# Patient Record
Sex: Male | Born: 1955 | Race: White | Hispanic: No | Marital: Single | Smoking: Never smoker
Health system: Southern US, Community
[De-identification: ages and names within clinical notes are randomized; demographics above are authoritative.]

## PROBLEM LIST (undated history)

## (undated) DIAGNOSIS — Z930 Tracheostomy status: Secondary | ICD-10-CM

## (undated) DIAGNOSIS — J9621 Acute and chronic respiratory failure with hypoxia: Secondary | ICD-10-CM

## (undated) DIAGNOSIS — J189 Pneumonia, unspecified organism: Secondary | ICD-10-CM

## (undated) DIAGNOSIS — G7100 Muscular dystrophy, unspecified: Secondary | ICD-10-CM

## (undated) HISTORY — PX: CHOLECYSTECTOMY: SHX55

---

## 2014-02-27 ENCOUNTER — Encounter (HOSPITAL_COMMUNITY): Payer: Self-pay | Admitting: Emergency Medicine

## 2014-02-27 ENCOUNTER — Observation Stay (HOSPITAL_COMMUNITY)
Admission: EM | Admit: 2014-02-27 | Discharge: 2014-02-28 | Disposition: A | Discharge status: Home / Self Care | Payer: Medicare Other | Attending: Family Medicine | Admitting: Family Medicine

## 2014-02-27 ENCOUNTER — Emergency Department (HOSPITAL_COMMUNITY): Payer: Medicare Other

## 2014-02-27 DIAGNOSIS — R0602 Shortness of breath: Secondary | ICD-10-CM | POA: Insufficient documentation

## 2014-02-27 DIAGNOSIS — G7111 Myotonic muscular dystrophy: Secondary | ICD-10-CM | POA: Diagnosis present

## 2014-02-27 DIAGNOSIS — R059 Cough, unspecified: Secondary | ICD-10-CM | POA: Diagnosis not present

## 2014-02-27 DIAGNOSIS — R42 Dizziness and giddiness: Secondary | ICD-10-CM | POA: Diagnosis not present

## 2014-02-27 DIAGNOSIS — R0789 Other chest pain: Principal | ICD-10-CM | POA: Insufficient documentation

## 2014-02-27 DIAGNOSIS — R55 Syncope and collapse: Secondary | ICD-10-CM | POA: Diagnosis not present

## 2014-02-27 DIAGNOSIS — R05 Cough: Secondary | ICD-10-CM | POA: Diagnosis not present

## 2014-02-27 DIAGNOSIS — G7109 Other specified muscular dystrophies: Secondary | ICD-10-CM | POA: Diagnosis not present

## 2014-02-27 DIAGNOSIS — E785 Hyperlipidemia, unspecified: Secondary | ICD-10-CM | POA: Diagnosis present

## 2014-02-27 DIAGNOSIS — I959 Hypotension, unspecified: Secondary | ICD-10-CM | POA: Diagnosis not present

## 2014-02-27 DIAGNOSIS — R079 Chest pain, unspecified: Secondary | ICD-10-CM | POA: Diagnosis present

## 2014-02-27 DIAGNOSIS — G47419 Narcolepsy without cataplexy: Secondary | ICD-10-CM

## 2014-02-27 HISTORY — DX: Muscular dystrophy, unspecified: G71.00

## 2014-02-27 LAB — BASIC METABOLIC PANEL
ANION GAP: 14 (ref 5–15)
BUN: 13 mg/dL (ref 6–23)
CALCIUM: 9.2 mg/dL (ref 8.4–10.5)
CO2: 26 mEq/L (ref 19–32)
Chloride: 103 mEq/L (ref 96–112)
Creatinine, Ser: 0.79 mg/dL (ref 0.50–1.35)
GFR calc non Af Amer: >90 mL/min (ref >90)
Glucose, Bld: 114 mg/dL — ABNORMAL HIGH (ref 70–99)
POTASSIUM: 4 meq/L (ref 3.7–5.3)
SODIUM: 143 meq/L (ref 137–147)

## 2014-02-27 LAB — I-STAT TROPONIN, ED: TROPONIN I, POC: 0.01 ng/mL (ref 0.00–0.08)

## 2014-02-27 LAB — CBC
HCT: 39.6 % (ref 39.0–52.0)
Hemoglobin: 13 g/dL (ref 13.0–17.0)
MCH: 30.9 pg (ref 26.0–34.0)
MCHC: 32.8 g/dL (ref 30.0–36.0)
MCV: 94.1 fL (ref 78.0–100.0)
PLATELETS: 235 10*3/uL (ref 150–400)
RBC: 4.21 MIL/uL — ABNORMAL LOW (ref 4.22–5.81)
RDW: 13.4 % (ref 11.5–15.5)
WBC: 5.3 10*3/uL (ref 4.0–10.5)

## 2014-02-27 LAB — PROTIME-INR
INR: 0.98 (ref 0.00–1.49)
Prothrombin Time: 13 seconds (ref 11.6–15.2)

## 2014-02-27 LAB — ETHANOL: Alcohol, Ethyl (B): <11 mg/dL (ref 0–11)

## 2014-02-27 LAB — PRO B NATRIURETIC PEPTIDE: Pro B Natriuretic peptide (BNP): 46 pg/mL (ref 0–125)

## 2014-02-27 MED ORDER — ASPIRIN 81 MG PO CHEW
81.0000 mg | CHEWABLE_TABLET | Freq: Every day | ORAL | Status: DC
  Administered 2014-02-28: 81 mg via ORAL
  Filled 2014-02-27: qty 1

## 2014-02-27 MED ORDER — SODIUM CHLORIDE 0.9 % IV BOLUS (SEPSIS)
1000.0000 mL | INTRAVENOUS | Status: AC
  Administered 2014-02-27: 1000 mL via INTRAVENOUS

## 2014-02-27 NOTE — ED Notes (Signed)
Family arrived and updated on pt. Told we are still waiting for labs to result.

## 2014-02-27 NOTE — ED Notes (Signed)
Portable xray at bedside.

## 2014-02-27 NOTE — ED Notes (Signed)
Pt c/o intermittent chest pressure and sob. sts he has had a cold recently. Then today while he was outside felt very lightheaded and thought he was going to pass out, sts he became very sweating and weak feeling. Nad, skin warm and dry, resp e/u.

## 2014-02-27 NOTE — H&P (Signed)
Family Medicine Teaching [facility] Ad[facility] History and Physical Service Pager: [phone]  Patient name: Carl Galvan Medical record number: [id number] Date of birth: 01/03/1956 Age: 58 y.o. Gender: male  Primary Care Provider: No primary provider on file. Consultants: None Code Status: FULL   Chief Complaint: Pre-syncope episode   Assessment and Plan: Carl Catholicndre R Noblett is a 58 y.o. male presenting with a pre-syncope episode lasting 30 minutes followed by chest pressure. PMH is significant for myotonic muscular dystrophy, hypercholesteremia, and narcolepsy.  Pre-Syncope/Chest pressure: From the HPI, this does not seem like a vasovagal even. Glucose was 112 on admission. EKG showed an incomplete RBBB in NSR. May have been secondary to dehydration as patient was hypotensive on arrival, responding well to IVF. Head CT with no acute changes. Patient also has a h/o myotonic muscular dystrophy which can lead to cardiac conduction abnormalities.  -Admit to cardiac telemetry  -Trop neg x1- cycle x 2 more -Repeat EKG in AM -Risk stratification labs: TSH, lipids, A1c pending -Consider echo -Consider cardiology consult  -May consider a Holter monitor as an outpatient to look for conduction abnormalities - Consider TTE in am      Hypotension: BP 90s/70s on admission, improved with IVF. From labs and exam, patient does not look dehydrated. VSS currently  - Continue to monitor  - NS @ 100cc/hr - 1 L bolus for hypotension if needed  Myotonic Muscular Dystrophy:Patient with baseline weakness in the ankles and wrists/hands. Notes that he is supposed to use BiPAP nightly due to diaphragm weakness but does not - Will hold BiPAP for now - [place] if needed at night.  - If O2 stats drop or increased work of breathing at night, low threshold for starting BiPAP  Bronchitis: Recently started on Z-pack by his PCP for what the patient describes as bronchitis. Chest Xray does not demonstrate any infiltrate.  Crackles on exam bilaterally. Due to his myotonic muscular dystrophy, pt is at an increased risk of PNA and other infections. - Continue Azithromycin (250 mg X 4 more doses)  Hypercholesterolemia: Unsure of patient's baseline. -Continue home regiment of pravastatin 80mg  daily -Lipid panel pending   Narcolepsy - Continue home regimen of Ritalin in AM  FEN/GI: heart healthy diet/ NS @ 100cc/hr  Prophylaxis: SubQ heparin   Disposition: Telemetry for observation. Suspect patient may go home tomorrow.   History of Present Illness: Carl Catholicndre R Renz is a 58 y.o. male presenting with a pre-syncope episode lasting 30 minutes followed by chest pressure in the ED.  The patient notes that he was in his usual state of health this AM drinking a beer in the shade when he began to feel dizzy, light-headed, diaphoretic, and clammy.  He denies being over-heated or dehydrated. Denies chest pain or SOB at that time. He went inside but had no improvement so EMS was called.  His wife noted that he appeared pale and did not "seem like himself," however denies any confusion, change in speech, or facial droop.  En route, he was found to have a RBBB and was given fluids and ASA. Code STEMI was called and canceled after cardiology saw the patient's EKG in the ED.   In the ED he was found to be hypotensive in the 90s/70s and responded well to fluid bolus.  He began to have chest pressure that last a few minutes that came and went without exertion. He notes that it did not radiate and denies any associated SOB or diaphoresis.    Review Of  Systems: 12 point ROS all negative in HPI with the exception of: Patient recently started getting a "chest cold" with SOB and cough. He was started on a Z-pack however has only had 1 day of treatment.  Otherwise 12 point review of systems was performed and was unremarkable.  Patient Active Problem List   Diagnosis Date Noted  . Chest pain 02/27/2014  . Near syncope 02/27/2014  . HLD  (hyperlipidemia) 02/27/2014   Past Medical History: Past Medical History  Diagnosis Date  . Muscular dystrophy    Past Surgical History: Past Surgical History  Procedure Laterality Date  . Cholecystectomy     Social History: History  Substance Use Topics  . Smoking status: Never Smoker   . Smokeless tobacco: Not on file  . Alcohol Use: Yes     Comment: occassionally    Please also refer to relevant sections of EMR.  Family History: No family history on file. Allergies and Medications: No Known Allergies No current facility-administered medications on file prior to encounter.   No current outpatient prescriptions on file prior to encounter.    Objective: BP 123/75  Pulse 75  Temp(Src) 97.4 F (36.3 C)  Resp 17  Ht 5\' 2"  (1.575 m)  Wt 170 lb (77.111 kg)  BMI 31.09 kg/m2  SpO2 95% Exam: General: 58 y/o male sitting up in bed in NAD. HEENT: Atraumatic. PERRLA. EOMI. Oropharynx clear. MMM. Cardiovascular: Regular rate and rhythm. Distant heart sounds. No murmurs, rubs, or gallops appreciated. No LE edema. Good radial and DP pulses bilaterally. Respiratory: Shallow breathing, poor air movement. Crackles heard throughout the lungs bilaterally, L>R. No wheezing or rhonchi noted.  Abdomen: +BS. Soft, non-distended. Non-tender. No rebound or guarding.  Skin: No rashes noted. Neuro: Speech clear although patient often mumbles. No facial droop. Facial movements symmetric. Atrophic changes of the ankles.  4+/5 plantarflexion bilaterally. Unable to dorsiflex bilaterally. 5/5 at the hips bilaterally. 3/5 in hand grip bilaterally. BL foot drop more pronounced on the R  Labs and Imaging:  CT of Head:No acute abnormality noted. CXR: The study is quite limited due to hypoinflation. There is no evidence of pneumonia nor CHF.   CBC BMET   Recent Labs Lab 02/27/14 1830  WBC 5.3  HGB 13.0  HCT 39.6  PLT 235    Recent Labs Lab 02/27/14 1830  NA 143  K 4.0  CL 103  CO2  26  BUN 13  CREATININE 0.79  GLUCOSE 114*  CALCIUM 9.2    Joanna Puffrystal S Dorsey, MD 02/27/2014, 10:03 PM PGY-1, [facility] FPTS Intern pager: [phone], text pages welcome  I have seen Mr. Etta GrandchildGoneau with Dr. Leonides Schanzorsey and I agree with her documentation above. My modifications are in blue.   Murtis SinkSam Bradshaw, MD [facility] Resident, PGY-3 02/27/2014, 10:52 PM

## 2014-02-27 NOTE — ED Notes (Signed)
EKG from EMS was transmitted and seen by D. Mackelhaney, who canceled the code STEMI.

## 2014-02-27 NOTE — ED Notes (Signed)
Internal medicine consult at bedside.

## 2014-02-27 NOTE — ED Provider Notes (Signed)
CSN: [id number]     Arrival date & time 02/27/14  1811 History   First MD Initiated Contact with Patient 02/27/14 1818     Chief Complaint  Patient presents with  . Code STEMI  . Shortness of Breath  . Chest Pain     (Consider location/radiation/quality/duration/timing/severity/associated sxs/prior Treatment) Patient is a 58 y.o. male presenting with near-syncope. The history is provided by the patient.  Near Syncope This is a new problem. Episode onset: 1-3 hours ago. Episode frequency: once. The problem has been resolved. Associated symptoms include chest pain and shortness of breath. Pertinent negatives include no abdominal pain and no headaches. The symptoms are aggravated by walking. The symptoms are relieved by rest. He has tried nothing for the symptoms. The treatment provided significant relief.    Past Medical History  Diagnosis Date  . Muscular dystrophy    Past Surgical History  Procedure Laterality Date  . Cholecystectomy     No family history on file. History  Substance Use Topics  . Smoking status: Never Smoker   . Smokeless tobacco: Not on file  . Alcohol Use: Yes     Comment: occassionally    Review of Systems  Constitutional: Negative for fever.  HENT: Negative for drooling and rhinorrhea.   Eyes: Negative for pain.  Respiratory: Positive for cough and shortness of breath.   Cardiovascular: Positive for chest pain and near-syncope. Negative for leg swelling.  Gastrointestinal: Negative for nausea, vomiting, abdominal pain and diarrhea.  Genitourinary: Negative for dysuria and hematuria.  Musculoskeletal: Negative for gait problem and neck pain.  Skin: Negative for color change.  Neurological: Positive for dizziness and light-headedness. Negative for numbness and headaches.  Hematological: Negative for adenopathy.  Psychiatric/Behavioral: Negative for behavioral problems.  All other systems reviewed and are negative.     Allergies  Review of  patient's allergies indicates no known allergies.  Home Medications   Prior to Admission medications   Not on File   BP 106/56  Pulse 82  Temp(Src) 97.4 F (36.3 C)  Resp 21  Ht 5\' 2"  (1.575 m)  Wt 170 lb (77.111 kg)  BMI 31.09 kg/m2  SpO2 95% Physical Exam  Nursing note and vitals reviewed. Constitutional: He is oriented to person, place, and time. He appears well-developed and well-nourished.  HENT:  Head: Normocephalic and atraumatic.  Right Ear: External ear normal.  Left Ear: External ear normal.  Nose: Nose normal.  Mouth/Throat: Oropharynx is clear and moist. No oropharyngeal exudate.  Eyes: Conjunctivae and EOM are normal. Pupils are equal, round, and reactive to light.  Neck: Normal range of motion. Neck supple.  Cardiovascular: Normal rate, regular rhythm, normal heart sounds and intact distal pulses.  Exam reveals no gallop and no friction rub.   No murmur heard. Pulmonary/Chest: Effort normal. No respiratory distress. He has no wheezes.  Rhonchorous in the bases bilaterally. Worse on the right lung base.  Abdominal: Soft. Bowel sounds are normal. He exhibits no distension. There is no tenderness. There is no rebound and no guarding.  Musculoskeletal: Normal range of motion. He exhibits no edema and no tenderness.  Neurological: He is alert and oriented to person, place, and time.  alert, oriented x3 speech: normal in context and clarity memory: intact grossly cranial nerves II-XII: intact motor strength: full proximally and distally w/ exception of plantar and dorsiflexion of his ankles which is weak but he reports unchanged d/t his muscular dystrophy no involuntary movements or tremors sensation: intact to light touch diffusely  cerebellar: finger-to-nose intact gait: normal forwards and backwards, pt feels wobbly   Skin: Skin is warm and dry.  Psychiatric: He has a normal mood and affect. His behavior is normal.    ED Course  Procedures (including  critical care time) Labs Review Labs Reviewed  CBC - Abnormal; Notable for the following:    RBC 4.21 (*)    All other components within normal limits  BASIC METABOLIC PANEL - Abnormal; Notable for the following:    Glucose, Bld 114 (*)    All other components within normal limits  LIPID PANEL - Abnormal; Notable for the following:    Cholesterol 239 (*)    Triglycerides 293 (*)    VLDL 59 (*)    LDL Cholesterol 138 (*)    All other components within normal limits  BASIC METABOLIC PANEL - Abnormal; Notable for the following:    Glucose, Bld 108 (*)    All other components within normal limits  PRO B NATRIURETIC PEPTIDE  PROTIME-INR  ETHANOL  TROPONIN I  TROPONIN I  TSH  HEMOGLOBIN A1C  I-STAT TROPOININ, ED    Imaging Review Dg Chest Port 1 View  02/27/2014   CLINICAL DATA:  Chest pressure and shortness of breath; upper respiratory infection symptoms for 2 weeks with nonproductive cough  EXAM: PORTABLE CHEST - 1 VIEW  COMPARISON:  None.  FINDINGS: The lungs are hypoinflated. The cardiac silhouette is obscured. There is no pleural effusion. The pulmonary vascularity is normal. The trachea is midline. The observed bony structures are normal.  IMPRESSION: The study is quite limited due to hypoinflation. There is no evidence of pneumonia nor CHF.   Electronically Signed   By: David  [place]azil[place]   On: 02/27/2014 18:41     EKG Interpretation   Date/Time:  Saturday February 27 2014 18:17:44 EDT Ventricular Rate:  91 PR Interval:  180 QRS Duration: 117 QT Interval:  385 QTC Calculation: 474 R Axis:     Text Interpretation:  Sinus rhythm Incomplete right bundle branch block  Confirmed by Gabrien Mentink  MD, Dorette Hartel ([id number]) on 02/27/2014 7:17:29 PM      MDM   Final diagnoses:  Chest pain at rest  Pre-syncope  Hypotension, unspecified hypotension type    7:12 PM 58 y.o. male with a history of muscular dystrophy, high cholesterol who presents with an episode of presyncope which occurred  several hours ago. He states that he became lightheaded/dizzy while sitting after drinking one beer. He became diaphoretic and stood up and walked into the house and laid down. His symptoms lasted approximately 30 minutes. His EKG was suspicious for MI in route but he could STEMI was called off by cardiology. He denies any chest pain or shortness of breath prior to arrival. He did state that he felt some chest pressure here for a brief period of time but denies this on exam currently. He is currently asymptomatic. He does feel wobbly when he walks but is able to ambulate. Will get screening labs and imaging.  Pt continues to appear well. Will admit d/t hypotension which seems to be resolving w/ IVF, episode of cp, pre-syncope.     Junius ArgyleForrest S Rigel Filsinger, MD 02/28/14 1019

## 2014-02-27 NOTE — ED Notes (Signed)
Per EMS - pt had a near syncopal episode then called ems. Upon arrival pt was pale and diaphoretic. BP 102 palpated. EKG showing STEMI. Initial BP was 90 systolic. HR 90s. Initial O2 sat 87%, put on 4 liters/min raised to 97%. 20 G in left hand. 18G in left forearm. Alert and oriented x 4. Hx of mitonic muscular dystrophy. NKA. Denies stents/MI. Administered 4 mg of ASA. Held off on nitro d/t BP. Pt c/o chest pressure rating at 2/10. Pt drank 1 beer prior to this. Was outside when all this started.

## 2014-02-28 DIAGNOSIS — R0602 Shortness of breath: Secondary | ICD-10-CM | POA: Diagnosis not present

## 2014-02-28 DIAGNOSIS — R55 Syncope and collapse: Secondary | ICD-10-CM | POA: Diagnosis not present

## 2014-02-28 DIAGNOSIS — R0789 Other chest pain: Secondary | ICD-10-CM | POA: Diagnosis not present

## 2014-02-28 DIAGNOSIS — I959 Hypotension, unspecified: Secondary | ICD-10-CM | POA: Diagnosis not present

## 2014-02-28 LAB — BASIC METABOLIC PANEL
Anion gap: 12 (ref 5–15)
BUN: 13 mg/dL (ref 6–23)
CHLORIDE: 108 meq/L (ref 96–112)
CO2: 27 meq/L (ref 19–32)
Calcium: 9 mg/dL (ref 8.4–10.5)
Creatinine, Ser: 0.75 mg/dL (ref 0.50–1.35)
GFR calc Af Amer: >90 mL/min (ref >90)
GFR calc non Af Amer: >90 mL/min (ref >90)
Glucose, Bld: 108 mg/dL — ABNORMAL HIGH (ref 70–99)
Potassium: 4.2 mEq/L (ref 3.7–5.3)
Sodium: 147 mEq/L (ref 137–147)

## 2014-02-28 LAB — TROPONIN I
Troponin I: <0.3 ng/mL (ref <0.30)
Troponin I: <0.3 ng/mL (ref <0.30)

## 2014-02-28 LAB — HEMOGLOBIN A1C
HEMOGLOBIN A1C: 5.4 % (ref <5.7)
Mean Plasma Glucose: 108 mg/dL (ref <117)

## 2014-02-28 LAB — LIPID PANEL
CHOL/HDL RATIO: 5.7 ratio
Cholesterol: 239 mg/dL — ABNORMAL HIGH (ref 0–200)
HDL: 42 mg/dL (ref >39)
LDL Cholesterol: 138 mg/dL — ABNORMAL HIGH (ref 0–99)
Triglycerides: 293 mg/dL — ABNORMAL HIGH (ref <150)
VLDL: 59 mg/dL — ABNORMAL HIGH (ref 0–40)

## 2014-02-28 LAB — TSH: TSH: 2.25 u[IU]/mL (ref 0.350–4.500)

## 2014-02-28 MED ORDER — SIMVASTATIN 40 MG PO TABS
40.0000 mg | ORAL_TABLET | Freq: Every day | ORAL | Status: DC
  Filled 2014-02-28: qty 1

## 2014-02-28 MED ORDER — IPRATROPIUM-ALBUTEROL 0.5-2.5 (3) MG/3ML IN SOLN
3.0000 mL | RESPIRATORY_TRACT | Status: DC | PRN
  Administered 2014-02-28: 3 mL via RESPIRATORY_TRACT
  Filled 2014-02-28: qty 3

## 2014-02-28 MED ORDER — ASPIRIN 81 MG PO CHEW: 81.0000 mg | CHEWABLE_TABLET | Freq: Every day | ORAL | Status: DC

## 2014-02-28 MED ORDER — AZITHROMYCIN 250 MG PO TABS
250.0000 mg | ORAL_TABLET | Freq: Every day | ORAL | Status: DC
  Administered 2014-02-28: 250 mg via ORAL
  Filled 2014-02-28: qty 1

## 2014-02-28 MED ORDER — HEPARIN SODIUM (PORCINE) 5000 UNIT/ML IJ SOLN
5000.0000 [IU] | Freq: Three times a day (TID) | INTRAMUSCULAR | Status: DC
  Administered 2014-02-28 (×2): 5000 [IU] via SUBCUTANEOUS
  Filled 2014-02-28 (×5): qty 1

## 2014-02-28 MED ORDER — ATORVASTATIN CALCIUM 40 MG PO TABS: 40.0000 mg | ORAL_TABLET | Freq: Every day | ORAL | Status: DC

## 2014-02-28 MED ORDER — IPRATROPIUM-ALBUTEROL 0.5-2.5 (3) MG/3ML IN SOLN
3.0000 mL | RESPIRATORY_TRACT | Status: DC
Start: 2014-02-28 — End: 2014-02-28

## 2014-02-28 MED ORDER — ATORVASTATIN CALCIUM 40 MG PO TABS
40.0000 mg | ORAL_TABLET | Freq: Every day | ORAL | Status: DC
  Filled 2014-02-28: qty 1

## 2014-02-28 MED ORDER — IPRATROPIUM-ALBUTEROL 0.5-2.5 (3) MG/3ML IN SOLN
3.0000 mL | Freq: Four times a day (QID) | RESPIRATORY_TRACT | Status: DC | PRN
  Administered 2014-02-28: 3 mL via RESPIRATORY_TRACT
  Filled 2014-02-28: qty 3

## 2014-02-28 MED ORDER — METHYLPHENIDATE HCL ER (LA) 10 MG PO CP24: 20.0000 mg | ORAL_CAPSULE | Freq: Two times a day (BID) | ORAL | Status: DC

## 2014-02-28 MED ORDER — ONDANSETRON HCL 4 MG/2ML IJ SOLN: 4.0000 mg | Freq: Four times a day (QID) | INTRAMUSCULAR | Status: DC | PRN

## 2014-02-28 MED ORDER — LORATADINE 10 MG PO TABS
10.0000 mg | ORAL_TABLET | Freq: Every day | ORAL | Status: DC
  Administered 2014-02-28: 10 mg via ORAL
  Filled 2014-02-28: qty 1

## 2014-02-28 MED ORDER — ACETAMINOPHEN 325 MG PO TABS: 650.0000 mg | ORAL_TABLET | ORAL | Status: DC | PRN

## 2014-02-28 MED ORDER — SODIUM CHLORIDE 0.9 % IV SOLN
INTRAVENOUS | Status: AC
  Administered 2014-02-28: via INTRAVENOUS

## 2014-02-28 NOTE — Progress Notes (Signed)
CALL PAGER [phone] for any questions or notifications regarding this patient   FMTS Attending Daily Note: Carl LevySara Talisha Erby MD  Attending pager:[phone]  office [phone]  I  have seen and examined this patient, reviewed their chart. I have discussed this patient with the resident. I agree with the resident's findings, assessment and care plan. I discussed his workup so far with him and his fiance at bedside. He is fairly complex medically . The workup to date has been negative. We had planned to get ECHO---just to rule out some new cardiomyopathy or other event that would contribute to his presyncope. We will not be able to get that until at least tomorrow AM. He would rather be discharged and do outpatient ECHo which I think is fine. It is not emergent. One of the main reasions I really want to get it for him is his underlying muscular dystrophy--he says he has not had f/u ECHO in several years.   I suspect that his presyncopal event was a combination of factors: 1) hot weather yesterday, outside for 4th of July celebration,  2) alcohol intake---he says this has never bothered him before but likely a contributor; 3) underlying muscular dystrophy and narcolepsy. His troponins were negative, EKG incomplete RBBB but otherwise negative. Certainly no sign of MI or myocardial injury. Telemetry non eventful. Electrolytes Ok. I think he is stable for d/c with out patiient ECHO in next week or so. We will give him written instructions reminding him to contact his local PCP to set this up as [place] and it would be longer trip for him to come here by his report.

## 2014-02-28 NOTE — Progress Notes (Signed)
Family Medicine Teaching Service Daily Progress Note Intern Pager: [phone]  Patient name: Carl Galvan Medical record number: [id number] Date of birth: 03/08/1956 Age: 58 y.o. Gender: male  Primary Care Provider: No primary provider on file. Consultants: None Code Status: FULL  Pt Overview and Major Events to Date:  58 y/o presenting with a pre-syncope episode lasting 30 minutes followed by chest pressure while in the ED.  Assessment and Plan: Carl Galvan is a 58 y.o. male presenting with a pre-syncope episode lasting 30 minutes followed by chest pressure. PMH is significant for myotonic muscular dystrophy, hypercholesteremia, and narcolepsy.  Pre-Syncope/Chest pressure: From the narrative, this does not seem like a vasovagal even. Glucose was 112 on admission. EKG showed an incomplete RBBB in NSR. May have been secondary to dehydration as patient was hypotensive on arrival, responding well to IVF. Head CT with no acute changes. Patient also has a h/o myotonic muscular dystrophy which can lead to cardiac conduction abnormalities.  -Admit to cardiac telemetry  -Trop neg 3 -Repeat EKG in AM showed bradycardia with a 1st degree AV block and what appears to be an interventricular conduction abnormality in III, no incomplete RBBB noted. -Risk stratification labs: A1c pending  -Consider echocardiogram  -May consider a Holter monitor as an outpatient to look for conduction abnormalities   Hypotension: BP 90s/70s on admission, improved with IVF. From labs and exam, patient does not look dehydrated. VSS. Orthostatic vitals normal.  - Continue to monitor   - NS @ 100cc/hr  - 1 L bolus for hypotension if needed   Myotonic Muscular Dystrophy:Patient with baseline weakness in the ankles and wrists/hands. Notes that he is supposed to use BiPAP nightly due to diaphragm weakness but does not  - Will hold BiPAP for now  - [place] if needed at night.  - [place]one[place] PRN - If O2 stats drop or increased work  of breathing at night, low threshold for starting BiPAP   Bronchitis: Recently started on Z-pack by his PCP for what the patient describes as bronchitis. Chest Xray does not demonstrate any infiltrate. Crackles on exam bilaterally. Due to PMH of myotonic muscular dystrophy, pt is at an increased risk of PNA and other infections.  - Continue Azithromycin 250mg  x 4 doses (1st dose 7/5)  Hypercholesterolemia: Unsure of patient's baseline. Non-fasting lipid panel revealed a grossly elevated cholesterol at 239, triglycerides at 293, HDL to 42, and LDL at 138 -Given high CV risk will increase to high intensity statin: start atorvastatin 40mg  daily.  Narcolepsy  - Continue home regimen of Ritalin in AM   FEN/GI: heart healthy diet/ NS @ 100cc/hr  Prophylaxis: SubQ heparin   Disposition: Telemetry for observation. Suspect patient may go home today or tomorrow.  Subjective:  Over night the patient is doing well. He wonders why his BP has been low but denies any symptoms of the hypotension. He denies any SOB, chest pain, light headedness, or dizziness. He endorses chest congestion which he's had "forever" that no one can figure out.  Objective: Temp:  [97.3 F (36.3 C)-97.4 F (36.3 C)] 97.3 F (36.3 C) (07/05 0000) Pulse Rate:  [69-95] 85 (07/05 0107) Resp:  [14-27] 20 (07/05 0107) BP: (92-123)/(56-77) 108/69 mmHg (07/05 0000) SpO2:  [89 %-98 %] 94 % (07/05 0107) Weight:  [170 lb (77.111 kg)-174 lb 3.2 oz (79.017 kg)] 174 lb 3.2 oz (79.017 kg) (07/05 0000) Physical Exam: General: 58 y/o male sitting up in bed in NAD.  HEENT: Atraumatic. PERRLA. EOMI. Oropharynx clear.  MMM.  Cardiovascular: Regular rate and rhythm. Distant heart sounds. No murmurs, rubs, or gallops appreciated. No LE edema. Good radial and DP pulses bilaterally.  Respiratory: Shallow breathing, poor air movement. Crackles heard throughout the lungs bilaterally, L>R. No wheezing or rhonchi noted.  Abdomen: +BS. Soft,  non-distended. Non-tender. No rebound or guarding.  Skin: No rashes noted.  Neuro: Speech clear. No facial droop. Facial movements symmetric. No focal deficits  Laboratory:  Recent Labs Lab 02/27/14 1830  WBC 5.3  HGB 13.0  HCT 39.6  PLT 235    Recent Labs Lab 02/27/14 1830 02/28/14 0655  NA 143 147  K 4.0 4.2  CL 103 108  CO2 26 27  BUN 13 13  CREATININE 0.79 0.75  CALCIUM 9.2 9.0  GLUCOSE 114* 108*    TSH    Component Value Date/Time   TSH 2.250 02/28/2014 0125       Component Value Date/Time   CHOL 239* 02/28/2014 0125   TRIG 293* 02/28/2014 0125   HDL 42 02/28/2014 0125   CHOLHDL 5.7 02/28/2014 0125   VLDL 59* 02/28/2014 0125   LDLCALC 138* 02/28/2014 0125     Imaging/Diagnostic Tests: Ct Head Wo Contrast  02/27/2014   CLINICAL DATA:  Lightheadedness  EXAM: CT HEAD WITHOUT CONTRAST  TECHNIQUE: Contiguous axial images were obtained from the base of the skull through the vertex without intravenous contrast.  COMPARISON:  None.  FINDINGS: The bony calvarium is intact. The ventricles are of normal size and configuration. No findings to suggest acute hemorrhage, acute infarction or space-occupying mass lesion are noted.  IMPRESSION: No acute abnormality noted.   Electronically Signed   By: Alcide CleverMark  Lukens M.D.   On: 02/27/2014 19:33   Dg Chest Port 1 View  02/27/2014   CLINICAL DATA:  Chest pressure and shortness of breath; upper respiratory infection symptoms for 2 weeks with nonproductive cough  EXAM: PORTABLE CHEST - 1 VIEW  COMPARISON:  None.  FINDINGS: The lungs are hypoinflated. The cardiac silhouette is obscured. There is no pleural effusion. The pulmonary vascularity is normal. The trachea is midline. The observed bony structures are normal.  IMPRESSION: The study is quite limited due to hypoinflation. There is no evidence of pneumonia nor CHF.   Electronically Signed   By: David  [place]   On: 02/27/2014 18:41    Joanna Puffrystal S Dorsey, MD 02/28/2014, 4:34 AM PGY-1, [facility]  Family Medicine FPTS Intern pager: [phone], text pages welcome

## 2014-02-28 NOTE — Discharge Summary (Signed)
Family Medicine [facility] Discharge Summary  Patient name: Carl Galvan Medical record number: [id number] Date of birth: 12/14/1955 Age: 58 y.o. Gender: male Date of Admission: 02/27/2014  Date of Discharge: 02/28/2014 Admitting Physician: Nestor RampSara L Neal, MD  Primary Care Provider: No primary provider on file. Consultants: None  Indication for Hospitalization: Near syncope, chest pain   Discharge Diagnoses/Problem List:  Near syncope   Hyperlipidemia, uncontrolled  Myotonic muscular dystrophy  Bronchitis  Narcolepsy   Disposition: Discharge home, will follow up in the outpatient setting  Discharge Condition: Stable   Discharge Exam:  Temp: [97.3 F (36.3 C)-97.4 F (36.3 C)] 97.3 F (36.3 C) (07/05 0000)  Pulse Rate: [69-95] 85 (07/05 0107)  Resp: [14-27] 20 (07/05 0107)  BP: (92-123)/(56-77) 108/69 mmHg (07/05 0000)  SpO2: [89 %-98 %] 94 % (07/05 0107)  Weight: [170 lb (77.111 kg)-174 lb 3.2 oz (79.017 kg)] 174 lb 3.2 oz (79.017 kg) (07/05 0000)  Physical Exam:  General: 58 y/o male sitting up in bed in NAD.  HEENT: Atraumatic. PERRLA. EOMI. Oropharynx clear. MMM.  Cardiovascular: Regular rate and rhythm. Distant heart sounds. No murmurs, rubs, or gallops appreciated. No LE edema. Good radial and DP pulses bilaterally.  Respiratory: Shallow breathing, poor air movement. Crackles heard throughout the lungs bilaterally, L>R. No wheezing or rhonchi noted.  Abdomen: +BS. Soft, non-distended. Non-tender. No rebound or guarding.  Skin: No rashes noted.  Neuro: Speech clear. No facial droop. Facial movements symmetric. No focal deficits  Brief Hospital Course:  Carl Galvan is a [age] y/o male with a PMH of muscular dystrophy who presented with near syncope. While outside in the shade drinking a beer he became dizzy, light-headed, diaphoretic, clammy, and pale. At that time he denied chest pain/pressure or SOB. Symptoms lasted 20-30 minutes. EMS arrived and an EKG revealed  an incomplete RBBB. Code STEMI was called and canceled after cardiology saw patient. While in the ED, he endorsed new chest pressure that lasted several minutes and then resolved. Troponins negative x 3. Repeat EKG in AM was not concerning. Risk stratification labs obtained. Lipid panel grossly elevated. Changed statin to Lipitor. Patient had 1 day of treatment of azithromycin as an outpatient for symptoms that sounded like bronchitis. CXR with no acute changes in ED. 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   Issues for Follow Up:  -Patient would benefit from an echocardiogram as an outpatient. Discussed possibility of performing it as an inpatient, however that would require another night stay. Telemetry not concerning and patient was asymptomatic therefore patient and MD agreed it could be done as an outpatient  -Given muscular dystrophy, pt is at a higher risk of cardiac conduction abnormalities. Consider outpatient Holter monitoring if pt has additional episodes.   Significant Procedures: None  Significant Labs and Imaging:   Recent Labs Lab 02/27/14 1830  WBC 5.3  HGB 13.0  HCT 39.6  PLT 235    Recent Labs Lab 02/27/14 1830 02/28/14 0655  NA 143 147  K 4.0 4.2  CL 103 108  CO2 26 27  GLUCOSE 114* 108*  BUN 13 13  CREATININE 0.79 0.75  CALCIUM 9.2 9.0    Risk Stratification Labs  TSH    Component Value Date/Time   TSH 2.250 02/28/2014 0125   Hemoglobin A1C    Component Value Date/Time   HGBA1C 5.4 02/28/2014 0125  Lipid Panel     Component Value Date/Time   CHOL 239* 02/28/2014 0125   TRIG 293* 02/28/2014 0125   HDL 42 02/28/2014 0125   CHOLHDL 5.7 02/28/2014 0125   VLDL 59* 02/28/2014 0125   LDLCALC 138* 02/28/2014 0125     Results/Tests Pending at Time of Discharge:  None   Discharge Medications:    Medication List    STOP taking  these medications       pravastatin 80 MG tablet  Commonly known as:  PRAVACHOL      TAKE these medications       albuterol 108 (90 BASE) MCG/ACT inhaler  Commonly known as:  PROVENTIL HFA;VENTOLIN HFA  Inhale 1-2 puffs into the lungs every 6 (six) hours as needed for wheezing or shortness of breath.     aspirin 81 MG chewable tablet  Chew 1 tablet (81 mg total) by mouth daily.     atorvastatin 40 MG tablet  Commonly known as:  LIPITOR  Take 1 tablet (40 mg total) by mouth daily at 6 PM.     azithromycin 250 MG tablet  Commonly known as:  ZITHROMAX  Take 250 mg by mouth daily.     fexofenadine 180 MG tablet  Commonly known as:  ALLEGRA  Take 180 mg by mouth daily.     ipratropium-albuterol 0.5-2.5 (3) MG/3ML Soln  Commonly known as:  DUONEB  Take 3 mLs by nebulization every 6 (six) hours.     methylphenidate 20 MG 24 hr capsule  Commonly known as:  RITALIN LA  Take 20 mg by mouth 2 (two) times daily.        Discharge Instructions: Please refer to Patient Instructions section of EMR for full details.  Patient was counseled important signs and symptoms that should prompt return to medical care, changes in medications, dietary instructions, activity restrictions, and follow up appointments.   Follow-Up Appointments:     Follow-up Information   Follow up with Your primary care physician.      Call in 1 week to follow up. (For hospital follow up)       Call in 1 day to follow up. (To make appointment ASAP. With PCP to obtain Echo.)       Joanna Puffrystal S Morgon Pamer, MD 02/28/2014, 8:58 PM PGY-1, [facility]

## 2014-02-28 NOTE — Discharge Instructions (Signed)
A written prescription has been signed and should be given to you prior to leaving. This prescription is for an Echocardiogram to be performed. To fill this script, please see your Primary Care Provider and ask him/her to make an appointment for this Echocardiogram. Please do this within the week.   Supplied is information on Chest Pain with information of when to seek out medical care in the future.   Chest Pain (Nonspecific) It is often hard to give a diagnosis for the cause of chest pain. There is always a chance that your pain could be related to something serious, such as a heart attack or a blood clot in the lungs. You need to follow up with your doctor. HOME CARE  If antibiotic medicine was given, take it as directed by your doctor. Finish the medicine even if you start to feel better.  For the next few days, avoid activities that bring on chest pain. Continue physical activities as told by your doctor.  Do not use any tobacco products. This includes cigarettes, chewing tobacco, and e-cigarettes.  Avoid drinking alcohol.  Only take medicine as told by your doctor.  Follow your doctor's suggestions for more testing if your chest pain does not go away.  Keep all doctor visits you made. GET HELP IF:  Your chest pain does not go away, even after treatment.  You have a rash with blisters on your chest.  You have a fever. GET HELP RIGHT AWAY IF:   You have more pain or pain that spreads to your arm, neck, jaw, back, or belly (abdomen).  You have shortness of breath.  You cough more than usual or cough up blood.  You have very bad back or belly pain.  You feel sick to your stomach (nauseous) or throw up (vomit).  You have very bad weakness.  You pass out (faint).  You have chills. This is an emergency. Do not wait to see if the problems will go away. Call your local emergency services (911 in [place] Do not drive yourself to the hospital. MAKE SURE YOU:   Understand  these instructions.  Will watch your condition.  Will get help right away if you are not doing well or get worse. Document Released: 01/30/2008 Document Revised: 08/18/2013 Document Reviewed: 01/30/2008 [facility] Patient Information 2015 [employer] [place]. This information is not intended to replace advice given to you by your health care provider. Make sure you discuss any questions you have with your health care provider.

## 2014-02-28 NOTE — H&P (Signed)
Call Pager [phone] for any questions or notifications regarding this patient  FMTS Attending Admission Note: Less Woolsey MD Attending pager:[phone] [phone] I  have seen and examined this patient, reviewed their chart. I have discussed this patient with the resident. I agree with the resident's findings, assessment and care plan. 

## 2014-03-01 ENCOUNTER — Telehealth: Payer: Self-pay | Admitting: Family Medicine

## 2014-03-01 NOTE — Telephone Encounter (Signed)
Fiance calls, stating that patient was given a handwritten script for outpatient echocardiogram. The place where [place] having this [place] diagnosis code in order to perform echocardiogram. Please call fiance, Burna MortimerWanda at [phone] with this info. Please advise. Burna MortimerWanda states she could not read the name of MD that wrote script. Spoke to Dr. Ermalinda MemosBradshaw and he stated it was most likely Dr. Wende MottMcKeag.

## 2014-03-01 NOTE — Telephone Encounter (Signed)
Hi Renee, Can you help me with this?  PS: You even said this was going to happen!  Thank you!

## 2014-03-02 NOTE — Telephone Encounter (Signed)
The prescription was suppose to go to this persons PCP for communication stating he needed a Echo. The PCP will likely need to make the actual order. The code is  780.2. Please call his fiance Carl Galvan at [phone] and inform her of above. Thanks

## 2014-03-02 NOTE — Telephone Encounter (Signed)
Relayed message,Wanda fiance states she was only in need of DX code.Code given,she voiced great appreciation.Donyale Falcon, Virgel BouquetGiovanna S

## 2014-03-03 NOTE — Discharge Summary (Signed)
.  Family Medicine Teaching Service  Discharge Note : Attending Durward Matranga MD Pager [phone] Office [phone] I have seen and examined this patient, reviewed their chart and discussed discharge planning with the resident at the time of discharge. I agree with the discharge plan as above.  

## 2015-02-12 ENCOUNTER — Inpatient Hospital Stay (HOSPITAL_COMMUNITY): Payer: Medicare Other

## 2015-02-12 ENCOUNTER — Emergency Department (HOSPITAL_COMMUNITY): Payer: Medicare Other

## 2015-02-12 ENCOUNTER — Encounter (HOSPITAL_COMMUNITY): Payer: Self-pay | Admitting: Emergency Medicine

## 2015-02-12 ENCOUNTER — Inpatient Hospital Stay (HOSPITAL_COMMUNITY)
Admission: EM | Admit: 2015-02-12 | Discharge: 2015-02-20 | DRG: 004 | Disposition: A | Discharge status: Other Acute Inpatient Hospital | Payer: Medicare Other | Attending: Internal Medicine | Admitting: Internal Medicine

## 2015-02-12 DIAGNOSIS — J80 Acute respiratory distress syndrome: Secondary | ICD-10-CM | POA: Diagnosis present

## 2015-02-12 DIAGNOSIS — N179 Acute kidney failure, unspecified: Secondary | ICD-10-CM | POA: Diagnosis not present

## 2015-02-12 DIAGNOSIS — Z7982 Long term (current) use of aspirin: Secondary | ICD-10-CM

## 2015-02-12 DIAGNOSIS — Z9911 Dependence on respirator [ventilator] status: Secondary | ICD-10-CM

## 2015-02-12 DIAGNOSIS — R4189 Other symptoms and signs involving cognitive functions and awareness: Secondary | ICD-10-CM

## 2015-02-12 DIAGNOSIS — E876 Hypokalemia: Secondary | ICD-10-CM | POA: Diagnosis present

## 2015-02-12 DIAGNOSIS — J9600 Acute respiratory failure, unspecified whether with hypoxia or hypercapnia: Secondary | ICD-10-CM | POA: Diagnosis present

## 2015-02-12 DIAGNOSIS — G7111 Myotonic muscular dystrophy: Secondary | ICD-10-CM | POA: Diagnosis present

## 2015-02-12 DIAGNOSIS — R1310 Dysphagia, unspecified: Secondary | ICD-10-CM | POA: Diagnosis present

## 2015-02-12 DIAGNOSIS — R739 Hyperglycemia, unspecified: Secondary | ICD-10-CM | POA: Diagnosis present

## 2015-02-12 DIAGNOSIS — Z452 Encounter for adjustment and management of vascular access device: Secondary | ICD-10-CM

## 2015-02-12 DIAGNOSIS — I959 Hypotension, unspecified: Secondary | ICD-10-CM | POA: Diagnosis present

## 2015-02-12 DIAGNOSIS — G47419 Narcolepsy without cataplexy: Secondary | ICD-10-CM | POA: Diagnosis present

## 2015-02-12 DIAGNOSIS — E2740 Unspecified adrenocortical insufficiency: Secondary | ICD-10-CM | POA: Diagnosis present

## 2015-02-12 DIAGNOSIS — Z9981 Dependence on supplemental oxygen: Secondary | ICD-10-CM

## 2015-02-12 DIAGNOSIS — R410 Disorientation, unspecified: Secondary | ICD-10-CM | POA: Diagnosis not present

## 2015-02-12 DIAGNOSIS — J9690 Respiratory failure, unspecified, unspecified whether with hypoxia or hypercapnia: Secondary | ICD-10-CM

## 2015-02-12 DIAGNOSIS — R6521 Severe sepsis with septic shock: Secondary | ICD-10-CM | POA: Diagnosis not present

## 2015-02-12 DIAGNOSIS — Z931 Gastrostomy status: Secondary | ICD-10-CM

## 2015-02-12 DIAGNOSIS — Z9119 Patient's noncompliance with other medical treatment and regimen: Secondary | ICD-10-CM | POA: Diagnosis present

## 2015-02-12 DIAGNOSIS — Z930 Tracheostomy status: Secondary | ICD-10-CM | POA: Diagnosis not present

## 2015-02-12 DIAGNOSIS — J189 Pneumonia, unspecified organism: Secondary | ICD-10-CM | POA: Diagnosis not present

## 2015-02-12 DIAGNOSIS — Y95 Nosocomial condition: Secondary | ICD-10-CM | POA: Diagnosis present

## 2015-02-12 DIAGNOSIS — E871 Hypo-osmolality and hyponatremia: Secondary | ICD-10-CM | POA: Diagnosis not present

## 2015-02-12 DIAGNOSIS — J9601 Acute respiratory failure with hypoxia: Secondary | ICD-10-CM | POA: Diagnosis not present

## 2015-02-12 DIAGNOSIS — Z978 Presence of other specified devices: Secondary | ICD-10-CM

## 2015-02-12 DIAGNOSIS — G710 Muscular dystrophy: Secondary | ICD-10-CM | POA: Diagnosis present

## 2015-02-12 DIAGNOSIS — A419 Sepsis, unspecified organism: Secondary | ICD-10-CM | POA: Diagnosis present

## 2015-02-12 DIAGNOSIS — R6520 Severe sepsis without septic shock: Secondary | ICD-10-CM

## 2015-02-12 DIAGNOSIS — E870 Hyperosmolality and hypernatremia: Secondary | ICD-10-CM | POA: Diagnosis not present

## 2015-02-12 DIAGNOSIS — R0602 Shortness of breath: Secondary | ICD-10-CM | POA: Diagnosis present

## 2015-02-12 DIAGNOSIS — Z789 Other specified health status: Secondary | ICD-10-CM | POA: Diagnosis not present

## 2015-02-12 LAB — CBC
HEMATOCRIT: 36.2 % — AB (ref 39.0–52.0)
Hemoglobin: 11.6 g/dL — ABNORMAL LOW (ref 13.0–17.0)
MCH: 30.6 pg (ref 26.0–34.0)
MCHC: 32 g/dL (ref 30.0–36.0)
MCV: 95.5 fL (ref 78.0–100.0)
PLATELETS: 164 10*3/uL (ref 150–400)
RBC: 3.79 MIL/uL — ABNORMAL LOW (ref 4.22–5.81)
RDW: 14.2 % (ref 11.5–15.5)
WBC: 9.2 10*3/uL (ref 4.0–10.5)

## 2015-02-12 LAB — CBC WITH DIFFERENTIAL/PLATELET
BASOS PCT: 0 % (ref 0–1)
Basophils Absolute: 0 10*3/uL (ref 0.0–0.1)
EOS PCT: 0 % (ref 0–5)
Eosinophils Absolute: 0 10*3/uL (ref 0.0–0.7)
HCT: 40.6 % (ref 39.0–52.0)
Hemoglobin: 13.5 g/dL (ref 13.0–17.0)
Lymphocytes Relative: 7 % — ABNORMAL LOW (ref 12–46)
Lymphs Abs: 0.5 10*3/uL — ABNORMAL LOW (ref 0.7–4.0)
MCH: 31.3 pg (ref 26.0–34.0)
MCHC: 33.3 g/dL (ref 30.0–36.0)
MCV: 94 fL (ref 78.0–100.0)
MONOS PCT: 7 % (ref 3–12)
Monocytes Absolute: 0.5 10*3/uL (ref 0.1–1.0)
NEUTROS PCT: 86 % — AB (ref 43–77)
Neutro Abs: 6.9 10*3/uL (ref 1.7–7.7)
Platelets: 150 10*3/uL (ref 150–400)
RBC: 4.32 MIL/uL (ref 4.22–5.81)
RDW: 13.8 % (ref 11.5–15.5)
WBC: 7.9 10*3/uL (ref 4.0–10.5)

## 2015-02-12 LAB — POCT I-STAT 3, ART BLOOD GAS (G3+)
ACID-BASE DEFICIT: 5 mmol/L — AB (ref 0.0–2.0)
Acid-base deficit: 6 mmol/L — ABNORMAL HIGH (ref 0.0–2.0)
BICARBONATE: 23.3 meq/L (ref 20.0–24.0)
Bicarbonate: 20 mEq/L (ref 20.0–24.0)
O2 SAT: 95 %
O2 Saturation: 96 %
PCO2 ART: 62.3 mmHg — AB (ref 35.0–45.0)
Patient temperature: 97.7
Patient temperature: 99.9
TCO2: 25 mmol/L (ref 0–100)
TCO2: 21 mmol/L (ref 0–100)
pCO2 arterial: 36.5 mmHg (ref 35.0–45.0)
pH, Arterial: 7.178 — CL (ref 7.350–7.450)
pH, Arterial: 7.35 (ref 7.350–7.450)
pO2, Arterial: 101 mmHg — ABNORMAL HIGH (ref 80.0–100.0)
pO2, Arterial: 80 mmHg (ref 80.0–100.0)

## 2015-02-12 LAB — BASIC METABOLIC PANEL
Anion gap: 8 (ref 5–15)
BUN: 15 mg/dL (ref 6–20)
CALCIUM: 9.2 mg/dL (ref 8.9–10.3)
CO2: 28 mmol/L (ref 22–32)
CREATININE: 0.76 mg/dL (ref 0.61–1.24)
Chloride: 104 mmol/L (ref 101–111)
GFR calc Af Amer: >60 mL/min (ref >60)
GLUCOSE: 91 mg/dL (ref 65–99)
Potassium: 3.5 mmol/L (ref 3.5–5.1)
Sodium: 140 mmol/L (ref 135–145)

## 2015-02-12 LAB — MRSA PCR SCREENING

## 2015-02-12 LAB — CREATININE, SERUM: Creatinine, Ser: 0.72 mg/dL (ref 0.61–1.24)

## 2015-02-12 LAB — STREP PNEUMONIAE URINARY ANTIGEN: Strep Pneumo Urinary Antigen: NEGATIVE

## 2015-02-12 LAB — GLUCOSE, CAPILLARY
GLUCOSE-CAPILLARY: 125 mg/dL — AB (ref 65–99)
Glucose-Capillary: 105 mg/dL — ABNORMAL HIGH (ref 65–99)

## 2015-02-12 LAB — BRAIN NATRIURETIC PEPTIDE: B NATRIURETIC PEPTIDE 5: 127.8 pg/mL — AB (ref 0.0–100.0)

## 2015-02-12 LAB — I-STAT CG4 LACTIC ACID, ED: Lactic Acid, Venous: 1.88 mmol/L (ref 0.5–2.0)

## 2015-02-12 MED ORDER — FENTANYL CITRATE (PF) 100 MCG/2ML IJ SOLN
100.0000 ug | Freq: Once | INTRAMUSCULAR | Status: AC
  Administered 2015-02-12: 100 ug via INTRAVENOUS

## 2015-02-12 MED ORDER — SODIUM CHLORIDE 0.9 % IV SOLN: 250.0000 mL | INTRAVENOUS | Status: DC | PRN

## 2015-02-12 MED ORDER — IPRATROPIUM-ALBUTEROL 0.5-2.5 (3) MG/3ML IN SOLN
3.0000 mL | RESPIRATORY_TRACT | Status: DC
  Administered 2015-02-12 – 2015-02-19 (×44): 3 mL via RESPIRATORY_TRACT
  Filled 2015-02-12 (×44): qty 3

## 2015-02-12 MED ORDER — ONDANSETRON HCL 4 MG/2ML IJ SOLN
4.0000 mg | Freq: Once | INTRAMUSCULAR | Status: AC
  Administered 2015-02-12: 4 mg via INTRAVENOUS
  Filled 2015-02-12: qty 2

## 2015-02-12 MED ORDER — ASPIRIN 300 MG RE SUPP: 300.0000 mg | RECTAL | Status: AC

## 2015-02-12 MED ORDER — DEXTROSE 5 % IV SOLN
500.0000 mg | Freq: Once | INTRAVENOUS | Status: AC
  Administered 2015-02-12: 500 mg via INTRAVENOUS
  Filled 2015-02-12: qty 500

## 2015-02-12 MED ORDER — MIDAZOLAM HCL 2 MG/2ML IJ SOLN
2.0000 mg | Freq: Once | INTRAMUSCULAR | Status: AC
  Administered 2015-02-12: 2 mg via INTRAVENOUS

## 2015-02-12 MED ORDER — SODIUM CHLORIDE 0.9 % IV SOLN: INTRAVENOUS | Status: DC

## 2015-02-12 MED ORDER — CEFTRIAXONE SODIUM IN DEXTROSE 20 MG/ML IV SOLN
1.0000 g | INTRAVENOUS | Status: DC
  Administered 2015-02-13: 1 g via INTRAVENOUS
  Filled 2015-02-12 (×2): qty 50

## 2015-02-12 MED ORDER — MORPHINE SULFATE 4 MG/ML IJ SOLN
4.0000 mg | Freq: Once | INTRAMUSCULAR | Status: AC
  Administered 2015-02-12: 4 mg via INTRAVENOUS
  Filled 2015-02-12: qty 1

## 2015-02-12 MED ORDER — SODIUM CHLORIDE 0.9 % IV BOLUS (SEPSIS)
2300.0000 mL | Freq: Once | INTRAVENOUS | Status: AC
Start: 2015-02-12 — End: 2015-02-12
  Administered 2015-02-12: 2300 mL via INTRAVENOUS

## 2015-02-12 MED ORDER — DEXTROSE 5 % IV SOLN
5.0000 ug/min | INTRAVENOUS | Status: DC
  Administered 2015-02-13 (×2): 16 ug/min via INTRAVENOUS
  Administered 2015-02-16: 7 ug/min via INTRAVENOUS
  Filled 2015-02-12 (×4): qty 16

## 2015-02-12 MED ORDER — FENTANYL CITRATE (PF) 100 MCG/2ML IJ SOLN
INTRAMUSCULAR | Status: AC
  Filled 2015-02-12: qty 4

## 2015-02-12 MED ORDER — CETYLPYRIDINIUM CHLORIDE 0.05 % MT LIQD
7.0000 mL | Freq: Four times a day (QID) | OROMUCOSAL | Status: DC
  Administered 2015-02-13 – 2015-02-20 (×30): 7 mL via OROMUCOSAL

## 2015-02-12 MED ORDER — CHLORHEXIDINE GLUCONATE CLOTH 2 % EX PADS
6.0000 | MEDICATED_PAD | Freq: Every day | CUTANEOUS | Status: AC
  Administered 2015-02-13 – 2015-02-17 (×5): 6 via TOPICAL

## 2015-02-12 MED ORDER — PANTOPRAZOLE SODIUM 40 MG IV SOLR
40.0000 mg | Freq: Every day | INTRAVENOUS | Status: DC
  Administered 2015-02-12 – 2015-02-18 (×7): 40 mg via INTRAVENOUS
  Filled 2015-02-12 (×8): qty 40

## 2015-02-12 MED ORDER — NOREPINEPHRINE BITARTRATE 1 MG/ML IV SOLN
5.0000 ug/min | INTRAVENOUS | Status: DC
  Administered 2015-02-12: 10 ug/min via INTRAVENOUS
  Administered 2015-02-12: 25 ug/min via INTRAVENOUS
  Administered 2015-02-12: 18 ug/min via INTRAVENOUS
  Filled 2015-02-12 (×4): qty 4

## 2015-02-12 MED ORDER — CEFTRIAXONE SODIUM 1 G IJ SOLR
1.0000 g | Freq: Once | INTRAMUSCULAR | Status: AC
  Administered 2015-02-12: 1 g via INTRAVENOUS
  Filled 2015-02-12: qty 10

## 2015-02-12 MED ORDER — MUPIROCIN 2 % EX OINT
1.0000 "application " | TOPICAL_OINTMENT | Freq: Two times a day (BID) | CUTANEOUS | Status: AC
  Administered 2015-02-12 – 2015-02-17 (×10): 1 via NASAL
  Filled 2015-02-12: qty 22

## 2015-02-12 MED ORDER — SODIUM CHLORIDE 0.9 % IV SOLN
INTRAVENOUS | Status: DC
  Administered 2015-02-12 – 2015-02-18 (×6): via INTRAVENOUS

## 2015-02-12 MED ORDER — FENTANYL CITRATE (PF) 100 MCG/2ML IJ SOLN
INTRAMUSCULAR | Status: AC
  Filled 2015-02-12: qty 2

## 2015-02-12 MED ORDER — ASPIRIN 81 MG PO CHEW: 324.0000 mg | CHEWABLE_TABLET | ORAL | Status: AC

## 2015-02-12 MED ORDER — ONDANSETRON HCL 4 MG/2ML IJ SOLN
4.0000 mg | Freq: Four times a day (QID) | INTRAMUSCULAR | Status: DC | PRN
  Administered 2015-02-19: 4 mg via INTRAVENOUS
  Filled 2015-02-12: qty 2

## 2015-02-12 MED ORDER — ACETAMINOPHEN 325 MG PO TABS
650.0000 mg | ORAL_TABLET | ORAL | Status: DC | PRN
  Administered 2015-02-19: 650 mg via ORAL
  Filled 2015-02-12: qty 2

## 2015-02-12 MED ORDER — SODIUM CHLORIDE 0.9 % IV SOLN
25.0000 ug/h | INTRAVENOUS | Status: DC
  Administered 2015-02-12: 100 ug/h via INTRAVENOUS
  Administered 2015-02-13 – 2015-02-14 (×5): 300 ug/h via INTRAVENOUS
  Administered 2015-02-15: 100 ug/h via INTRAVENOUS
  Administered 2015-02-18: 75 ug/h via INTRAVENOUS
  Administered 2015-02-19: 250 ug/h via INTRAVENOUS
  Filled 2015-02-12 (×12): qty 50

## 2015-02-12 MED ORDER — IPRATROPIUM-ALBUTEROL 0.5-2.5 (3) MG/3ML IN SOLN: 3.0000 mL | Freq: Four times a day (QID) | RESPIRATORY_TRACT | Status: DC

## 2015-02-12 MED ORDER — FENTANYL CITRATE (PF) 100 MCG/2ML IJ SOLN
100.0000 ug | Freq: Once | INTRAMUSCULAR | Status: AC
Start: 2015-02-12 — End: 2015-02-12
  Administered 2015-02-12: 100 ug via INTRAVENOUS

## 2015-02-12 MED ORDER — PANTOPRAZOLE SODIUM 40 MG PO PACK
80.0000 mg | PACK | Freq: Every day | ORAL | Status: DC
  Filled 2015-02-12: qty 40

## 2015-02-12 MED ORDER — ALBUTEROL SULFATE (2.5 MG/3ML) 0.083% IN NEBU: 2.5000 mg | INHALATION_SOLUTION | RESPIRATORY_TRACT | Status: DC | PRN

## 2015-02-12 MED ORDER — DEXTROSE 5 % IV SOLN
500.0000 mg | INTRAVENOUS | Status: AC
  Administered 2015-02-13 – 2015-02-16 (×4): 500 mg via INTRAVENOUS
  Filled 2015-02-12 (×4): qty 500

## 2015-02-12 MED ORDER — ETOMIDATE 2 MG/ML IV SOLN
20.0000 mg | Freq: Once | INTRAVENOUS | Status: AC
  Administered 2015-02-12: 20 mg via INTRAVENOUS

## 2015-02-12 MED ORDER — CHLORHEXIDINE GLUCONATE 0.12 % MT SOLN
15.0000 mL | Freq: Two times a day (BID) | OROMUCOSAL | Status: DC
  Administered 2015-02-12 – 2015-02-20 (×16): 15 mL via OROMUCOSAL
  Filled 2015-02-12 (×13): qty 15

## 2015-02-12 MED ORDER — ACETAMINOPHEN 650 MG RE SUPP
650.0000 mg | Freq: Once | RECTAL | Status: AC
  Administered 2015-02-12: 650 mg via RECTAL
  Filled 2015-02-12: qty 1

## 2015-02-12 MED ORDER — MIDAZOLAM HCL 2 MG/2ML IJ SOLN
2.0000 mg | INTRAMUSCULAR | Status: DC | PRN
  Administered 2015-02-13 (×3): 2 mg via INTRAVENOUS
  Filled 2015-02-12 (×3): qty 2

## 2015-02-12 MED ORDER — FENTANYL CITRATE (PF) 100 MCG/2ML IJ SOLN: 50.0000 ug | Freq: Once | INTRAMUSCULAR | Status: DC

## 2015-02-12 MED ORDER — ESOMEPRAZOLE MAGNESIUM 40 MG PO PACK: 40.0000 mg | PACK | Freq: Every day | ORAL | Status: DC

## 2015-02-12 MED ORDER — MIDAZOLAM HCL 2 MG/2ML IJ SOLN
2.0000 mg | INTRAMUSCULAR | Status: DC | PRN
  Administered 2015-02-13 (×2): 2 mg via INTRAVENOUS
  Filled 2015-02-12 (×2): qty 2

## 2015-02-12 MED ORDER — SODIUM CHLORIDE 0.9 % IV BOLUS (SEPSIS)
1000.0000 mL | Freq: Once | INTRAVENOUS | Status: AC
  Administered 2015-02-12: 1000 mL via INTRAVENOUS

## 2015-02-12 MED ORDER — HEPARIN SODIUM (PORCINE) 5000 UNIT/ML IJ SOLN
5000.0000 [IU] | Freq: Three times a day (TID) | INTRAMUSCULAR | Status: DC
  Administered 2015-02-12 – 2015-02-20 (×24): 5000 [IU] via SUBCUTANEOUS
  Filled 2015-02-12 (×27): qty 1

## 2015-02-12 MED ORDER — MIDAZOLAM HCL 2 MG/2ML IJ SOLN
INTRAMUSCULAR | Status: AC
  Filled 2015-02-12: qty 4

## 2015-02-12 MED ORDER — ACETAMINOPHEN 325 MG PO TABS
650.0000 mg | ORAL_TABLET | Freq: Once | ORAL | Status: DC
  Filled 2015-02-12: qty 2

## 2015-02-12 MED ORDER — FENTANYL BOLUS VIA INFUSION
50.0000 ug | INTRAVENOUS | Status: DC | PRN
  Filled 2015-02-12: qty 50

## 2015-02-12 NOTE — ED Notes (Signed)
CCM decided against bipap and will elect to intubate once pt gets upstairs if not handling NRB. Critical care at bedside preparing for central line placement at current time.

## 2015-02-12 NOTE — ED Notes (Signed)
Pt unable to tolerate Central line placement due to claustrophobia, pt restless under drape and pulling it off. Sterile technique ruined. Central line will be attempted at later time.

## 2015-02-12 NOTE — Progress Notes (Signed)
**Note De-identified  Obfuscation** Sputum collected and sent to lab 

## 2015-02-12 NOTE — ED Notes (Signed)
Given instruction to give pt a 3rd Liter of NS.

## 2015-02-12 NOTE — ED Notes (Signed)
Critical Care at bedside at this time.  

## 2015-02-12 NOTE — ED Notes (Signed)
Per EMS- pt normally wears 2 L O2 at home, was called out by significant other due to pt not wearing O2 sats were 86%. Pt complains of back pain and chest pain - 12 lead showed RBBB, rate at 118. Upper lobes ronchorous. Nasal congestion. Temp 100.7. Reports cough with white sputum production. Pt alert and oriented x4.

## 2015-02-12 NOTE — ED Notes (Signed)
MD James at bedside.  

## 2015-02-12 NOTE — ED Notes (Signed)
Pt received 200 cc NS in route by EMS.

## 2015-02-12 NOTE — ED Notes (Signed)
Pt sats at 82% on 3L. Pt bumped up to 4 L sats at 87%. Pt again increased to 6L O2

## 2015-02-12 NOTE — H&P (Signed)
PULMONARY / CRITICAL CARE MEDICINE   Name: Carl Galvan MRN: [id number] DOB: 03-01-1956    ADMISSION DATE:  02/12/2015 CONSULTATION DATE:  02/12/2015   REFERRING MD :  Fayrene Fearing , EDP  CHIEF COMPLAINT:  Respiratory distress  INITIAL PRESENTATION: 59 year old with myotonic dystrophy presents with bibasal pneumonia and hypoxic respiratory failure and hypotension with normal lactate  STUDIES:    SIGNIFICANT EVENTS:    HISTORY OF PRESENT ILLNESS:  59 year old with myotonic dystrophy at his baseline he has percutaneous gastrostomy tube, is able to ambulate and carry out all activities of daily living by himself. He reports cough with minimal sputum production for 2 days,Normally uses 2 L oxygen at home-he is supposed to be on nocturnal BiPAP for hypoventilation but does not use BIBEMS WITH SAT 86% , febrile 100.7. He required up to 6 L oxygen nasal cannula in the ED -and then was placed on nonrebreather due to desaturation . He was able to provide a complete history . He received 4 L of fluid due to hypotension . Notes from visit to sleep physician at [facility] in 05/2014 were reviewed    PAST MEDICAL HISTORY :   has a past medical history of Muscular dystrophy.  has past surgical history that includes Cholecystectomy. Prior to Admission medications   Medication Sig Start Date End Date Taking? Authorizing Provider  albuterol (PROVENTIL HFA;VENTOLIN HFA) 108 (90 BASE) MCG/ACT inhaler Inhale 1-2 puffs into the lungs every 6 (six) hours as needed for wheezing or shortness of breath.   Yes Historical Provider, MD  ALPRAZolam Prudy Feeler) 0.5 MG tablet Take 0.5 mg by mouth daily as needed for anxiety.   Yes Historical Provider, MD  cetirizine (ZYRTEC) 10 MG tablet Take 10 mg by mouth daily.   Yes Historical Provider, MD  esomeprazole (NEXIUM) 40 MG packet Take 40 mg by mouth daily.   Yes Historical Provider, MD  ipratropium-albuterol (DUONEB) 0.5-2.5 (3) MG/3ML SOLN Take 3 mLs by nebulization every 6  (six) hours.   Yes Historical Provider, MD  methylphenidate (RITALIN LA) 20 MG 24 hr capsule Take 20 mg by mouth 2 (two) times daily.   Yes Historical Provider, MD  OVER THE COUNTER MEDICATION Place 2 sprays into the nose 2 (two) times daily as needed (Nasal congestion). OTC nasal spray   Yes Historical Provider, MD  OXYGEN Inhale into the lungs as needed.   Yes Historical Provider, MD  polyethylene glycol (MIRALAX / GLYCOLAX) packet Take 17 g by mouth daily as needed for mild constipation.   Yes Historical Provider, MD  pravastatin (PRAVACHOL) 80 MG tablet Take 80 mg by mouth daily.   Yes Historical Provider, MD  aspirin 81 MG chewable tablet Chew 1 tablet (81 mg total) by mouth daily. Patient not taking: Reported on 02/12/2015 02/28/14   Joanna Puff, MD  atorvastatin (LIPITOR) 40 MG tablet Take 1 tablet (40 mg total) by mouth daily at 6 PM. Patient not taking: Reported on 02/12/2015 02/28/14   Joanna Puff, MD   No Known Allergies  FAMILY HISTORY:  has no family status information on file.  SOCIAL HISTORY:  reports that he has never smoked. He does not have any smokeless tobacco history on file. He reports that he drinks alcohol. He reports that he does not use illicit drugs.  REVIEW OF SYSTEMS:  Unable to obtain in detail due to respiratory distress  Reports low-grade fever  Sputum with white mucus  Denies chest pain , diarrhea , burning micturition , No skin  rash  SUBJECTIVE:   VITAL SIGNS: Temp:  [99.6 F (37.6 C)-103.9 F (39.9 C)] 99.6 F (37.6 C) (06/18 1214) Pulse Rate:  [101-110] 101 (06/18 1200) Resp:  [19-26] 25 (06/18 1045) BP: (92-112)/(35-70) 92/46 mmHg (06/18 1200) SpO2:  [94 %-96 %] 95 % (06/18 1200) HEMODYNAMICS:   VENTILATOR SETTINGS:   INTAKE / OUTPUT:  Intake/Output Summary (Last 24 hours) at 02/12/15 1254 Last data filed at 02/12/15 1248  Gross per 24 hour  Intake   3200 ml  Output    200 ml  Net   3000 ml    PHYSICAL EXAMINATION: General:   Chronically ill , in moderate respiratory distress  Neuro:  Nonfocal , power 4/5 all 4 extremities , some wasting of hand muscles  HEENT:  no JVD , left earring  Cardiovascular:  S1 and S2 tachycardia  Lungs:  Bilateral basal scattered crackles no rhonchi  Abdomen:  Soft nontender , PEG site clean  Musculoskeletal:  Wasting as well  Skin:  Mottling of the lower extremities , good pulses , cap refill + LABS:  CBC  Recent Labs Lab 02/12/15 1007  WBC 7.9  HGB 13.5  HCT 40.6  PLT 150   Coag's No results for input(s): APTT, INR in the last 168 hours. BMET  Recent Labs Lab 02/12/15 1007  NA 140  K 3.5  CL 104  CO2 28  BUN 15  CREATININE 0.76  GLUCOSE 91   Electrolytes  Recent Labs Lab 02/12/15 1007  CALCIUM 9.2   Sepsis Markers  Recent Labs Lab 02/12/15 1025  LATICACIDVEN 1.88   ABG No results for input(s): PHART, PCO2ART, PO2ART in the last 168 hours. Liver Enzymes No results for input(s): AST, ALT, ALKPHOS, BILITOT, ALBUMIN in the last 168 hours. Cardiac Enzymes No results for input(s): TROPONINI, PROBNP in the last 168 hours. Glucose No results for input(s): GLUCAP in the last 168 hours.  Imaging Dg Chest Port 1 View  02/12/2015   CLINICAL DATA:  Short of breath  EXAM: PORTABLE CHEST - 1 VIEW  COMPARISON:  02/27/2014  FINDINGS: Bilateral central and basilar airspace disease. Low volumes. Pleural effusions are not excluded. Cardiac silhouette obscured. No pneumothorax.  IMPRESSION: Bibasilar airspace disease. Bilateral pleural effusions are not excluded.   Electronically Signed   By: Jolaine Click M.D.   On: 02/12/2015 10:27     ASSESSMENT / PLAN:  PULMONARY OETT A:Acute respiratory failure , hypoxic  Chronic hypoventilation , neuromuscular  P:   His work of breathing is acceptable on nonrebreather  If worse , proceed with intubation  We will not use BiPAP  Chest vest  Tid to assist with secretion clearance   CARDIOVASCULAR CVL >> A: Severe  sepsis , normal lactate  P:  Received 4 L of fluids  Use Levophedgtt- him for MEP 65 and about  Once  central line inserted , check CVP for volume status   RENAL A:  No issues, at risk AKI P:   Monitor urine output with Foley   GASTROINTESTINAL A:  Chronic dysphagia s/p PEG Nothing by mouth for now   HEMATOLOGIC A:  No issues monitor white count  INFECTIOUS A:  CAP vs aspiration P:   BCx2 6/18 Sputum6/18 Abx:  Ceftriaxone start dat6/18  azithromycin 6/18 >>  ENDOCRINE  A:   no issues   P:    Monitor CBG while npo  NEUROLOGIC A:   myotonic dystrophy P:   RASS goal: NA    FAMILY  - Updates:  patient designated sister as [name]  - Inter-disciplinary family meet or Palliative Care meeting due by:  6/25    TODAY'S SUMMARY -Acute respiratory failure due to bibasal pneumonia and inability to clear secretions-may end up requiring mechanical ventilation   -will use pressors for low blood pressure although lactate normal  Volume status appears adequate after 4 L-good pulses and mental status, perfusion as indicated by cap Refill  and normal lactate is good  The patient is critically ill with multiple organ systems failure and requires high complexity decision making for assessment and support, frequent evaluation and titration of therapies, application of advanced monitoring technologies and extensive interpretation of multiple databases. Critical Care Time devoted to patient care services described in this note independent of APP time is 55 minutes.    Cyril Mourning MD. Tonny Bollman. [facility] Pager [phone] If no response call [phone]   02/12/2015, 12:54 PM

## 2015-02-12 NOTE — ED Notes (Signed)
Spoke with MD Fayrene Fearing regarding pt BP continuing to drop with 2L NS bolus' being pressured in as well as 3rd Liter hanging as bolus on new IV. Per MD give 4th L NS

## 2015-02-12 NOTE — ED Notes (Signed)
Pt growing very restless at this time. Trying to climb out of bed, stating he needs to sit up. Sats at 94% on NRB.

## 2015-02-12 NOTE — Procedures (Signed)
Intubation Procedure Note Carl Galvan [id number] 28-Mar-1956  Procedure: Intubation Indications: Respiratory insufficiency  Procedure Details Consent: Risks of procedure as well as the alternatives and risks of each were explained to the (patient/caregiver).  Consent for procedure obtained. Time Out: Verified patient identification, verified procedure, site/side was marked, verified correct patient position, special equipment/implants available, medications/allergies/relevent history reviewed, required imaging and test results available.  Performed  Maximum sterile technique was used including gloves, hand hygiene and mask.  MAC and 3 Versed 2 mg Fentanyl 200 mcg in divided doses Etomidate 20   Evaluation Hemodynamic Status: BP stable throughout; O2 sats: transiently fell during during procedure Patient's Current Condition: stable Complications: No apparent complications Patient did tolerate procedure well. Chest X-ray ordered to verify placement.  CXR: pending.   Carl Galvan V. 02/12/2015

## 2015-02-12 NOTE — Progress Notes (Signed)
eLink Physician-Brief Progress Note Patient Name: JOSEPHE FORRESTAL DOB: 1956/03/03 MRN: [id number]   Date of Service  02/12/2015  HPI/Events of Note  PCO2 elevated, pH low  eICU Interventions  Increase rate on vent      Intervention Category Major Interventions: Respiratory failure - evaluation and management  Shan Levans 02/12/2015, 5:29 PM

## 2015-02-12 NOTE — ED Notes (Signed)
Pt complaining of worsening shortness of breath on 6L O2 nasal cannula, sats at 88% at current time. Placed on NRB @ 15 L, sats up to 98%. Dr. Fayrene Fearing aware. Ordered to call respiratory and place pt on bipap and obtain an ABG. Jeff respiratory tech notified of MD orders and on the way to pt room.

## 2015-02-12 NOTE — Procedures (Signed)
Central Venous Catheter Insertion Procedure Note Carl Galvan [id number] 05-03-1956  Procedure: Insertion of Central Venous Catheter Indications: Assessment of intravascular volume, Drug and/or fluid administration and Frequent blood sampling  Procedure Details Consent: Risks of procedure as well as the alternatives and risks of each were explained to the (patient/caregiver).  Consent for procedure obtained. Time Out: Verified patient identification, verified procedure, site/side was marked, verified correct patient position, special equipment/implants available, medications/allergies/relevent history reviewed, required imaging and test results available.  Performed  Maximum sterile technique was used including antiseptics, cap, gloves, gown, hand hygiene, mask and sheet. Skin prep: Chlorhexidine; local anesthetic administered A antimicrobial bonded/coated triple lumen catheter was placed in the left internal jugular vein using the Seldinger technique.  Placed to 20 cm, sutured.   Evaluation Blood flow good Complications: No apparent complications Patient did tolerate procedure well. Chest X-ray ordered to verify placement.  CXR: pending.   Procedure performed under direct supervision of Dr. Vassie Loll and with ultrasound guidance for real time vessel cannulation.     Carl Brim, NP-C [facility] Pgr: [phone] or [phone]  02/12/2015, 3:43 PM

## 2015-02-12 NOTE — Progress Notes (Signed)
**Note De-Identified  Obfuscation** ABG panic value reported to RN.  ETT pulled back 2cm; currently 24cm @ lip.

## 2015-02-12 NOTE — ED Notes (Signed)
PT monitored by pulse ox, bp cuff, and 12-lead. 

## 2015-02-13 ENCOUNTER — Inpatient Hospital Stay (HOSPITAL_COMMUNITY): Payer: Medicare Other

## 2015-02-13 LAB — BASIC METABOLIC PANEL
Anion gap: 5 (ref 5–15)
BUN: 9 mg/dL (ref 6–20)
CALCIUM: 7.3 mg/dL — AB (ref 8.9–10.3)
CHLORIDE: 112 mmol/L — AB (ref 101–111)
CO2: 23 mmol/L (ref 22–32)
Creatinine, Ser: 0.72 mg/dL (ref 0.61–1.24)
GFR calc non Af Amer: >60 mL/min (ref >60)
Glucose, Bld: 126 mg/dL — ABNORMAL HIGH (ref 65–99)
POTASSIUM: 3.1 mmol/L — AB (ref 3.5–5.1)
Sodium: 140 mmol/L (ref 135–145)

## 2015-02-13 LAB — MAGNESIUM
MAGNESIUM: 1.5 mg/dL — AB (ref 1.7–2.4)
MAGNESIUM: 2.5 mg/dL — AB (ref 1.7–2.4)

## 2015-02-13 LAB — CBC
HCT: 31.1 % — ABNORMAL LOW (ref 39.0–52.0)
Hemoglobin: 10.3 g/dL — ABNORMAL LOW (ref 13.0–17.0)
MCH: 30.7 pg (ref 26.0–34.0)
MCHC: 33.1 g/dL (ref 30.0–36.0)
MCV: 92.8 fL (ref 78.0–100.0)
Platelets: 153 10*3/uL (ref 150–400)
RBC: 3.35 MIL/uL — ABNORMAL LOW (ref 4.22–5.81)
RDW: 14.4 % (ref 11.5–15.5)
WBC: 8.2 10*3/uL (ref 4.0–10.5)

## 2015-02-13 LAB — GLUCOSE, CAPILLARY: GLUCOSE-CAPILLARY: 217 mg/dL — AB (ref 65–99)

## 2015-02-13 LAB — PHOSPHORUS: Phosphorus: 1.2 mg/dL — ABNORMAL LOW (ref 2.5–4.6)

## 2015-02-13 MED ORDER — POTASSIUM CHLORIDE 10 MEQ/50ML IV SOLN
10.0000 meq | INTRAVENOUS | Status: AC
  Administered 2015-02-13 (×4): 10 meq via INTRAVENOUS
  Filled 2015-02-13 (×4): qty 50

## 2015-02-13 MED ORDER — VASOPRESSIN 20 UNIT/ML IV SOLN
0.0300 [IU]/min | INTRAVENOUS | Status: DC
  Administered 2015-02-13: 0.03 [IU]/min via INTRAVENOUS
  Filled 2015-02-13 (×2): qty 2

## 2015-02-13 MED ORDER — SODIUM CHLORIDE 0.9 % IV SOLN: 0.0300 [IU]/min | INTRAVENOUS | Status: DC

## 2015-02-13 MED ORDER — SODIUM CHLORIDE 0.9 % IV SOLN
30.0000 mmol | Freq: Once | INTRAVENOUS | Status: AC
  Administered 2015-02-13: 30 mmol via INTRAVENOUS
  Filled 2015-02-13: qty 10

## 2015-02-13 MED ORDER — VITAL HIGH PROTEIN PO LIQD
1000.0000 mL | ORAL | Status: DC
  Administered 2015-02-13: 22:00:00
  Administered 2015-02-13: 1000 mL
  Administered 2015-02-14: 06:00:00
  Filled 2015-02-13 (×4): qty 1000

## 2015-02-13 MED ORDER — VANCOMYCIN HCL IN DEXTROSE 1-5 GM/200ML-% IV SOLN
1000.0000 mg | Freq: Three times a day (TID) | INTRAVENOUS | Status: DC
  Administered 2015-02-13 – 2015-02-16 (×8): 1000 mg via INTRAVENOUS
  Filled 2015-02-13 (×11): qty 200

## 2015-02-13 MED ORDER — SODIUM CHLORIDE 0.9 % IV SOLN
1.0000 mg/h | INTRAVENOUS | Status: DC
  Administered 2015-02-13 – 2015-02-14 (×2): 2 mg/h via INTRAVENOUS
  Administered 2015-02-16: 1 mg/h via INTRAVENOUS
  Administered 2015-02-16: 2 mg/h via INTRAVENOUS
  Filled 2015-02-13 (×5): qty 10

## 2015-02-13 MED ORDER — SODIUM CHLORIDE 0.9 % IV SOLN
6.0000 g | Freq: Once | INTRAVENOUS | Status: AC
  Administered 2015-02-13: 6 g via INTRAVENOUS
  Filled 2015-02-13: qty 12

## 2015-02-13 NOTE — Progress Notes (Signed)
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 and hypoxic respiratory failure and septic shock with normal lactate H/o chronic noct hypoventilation, non compliant with bipap , narcolepsy on ritalin  STUDIES:    SIGNIFICANT EVENTS: 6/18 intubated - designated GF & son as POA, Copious secretions  SUBJECTIVE: Low gr fevers Copious secretions Good UO  VITAL SIGNS: Temp:  [98.8 F (37.1 C)-103.9 F (39.9 C)] 99.7 F (37.6 C) (06/19 0811) Pulse Rate:  [65-131] 78 (06/19 0700) Resp:  [18-27] 26 (06/19 0700) BP: (73-139)/(30-75) 97/50 mmHg (06/19 0700) SpO2:  [91 %-100 %] 98 % (06/19 0700) FiO2 (%):  [60 %-100 %] 60 % (06/19 0600) Weight:  [84.4 kg (186 lb 1.1 oz)-85.8 kg (189 lb 2.5 oz)] 85.8 kg (189 lb 2.5 oz) (06/19 0400) HEMODYNAMICS: CVP:  [9 mmHg-14 mmHg] 10 mmHg VENTILATOR SETTINGS: Vent Mode:  [-] PRVC FiO2 (%):  [60 %-100 %] 60 % Set Rate:  [18 bmp-26 bmp] 26 bmp Vt Set:  [520 mL] 520 mL PEEP:  [5 cmH20] 5 cmH20 Plateau Pressure:  [18 cmH20] 18 cmH20 INTAKE / OUTPUT:  Intake/Output Summary (Last 24 hours) at 02/13/15 0829 Last data filed at 02/13/15 0700  Gross per 24 hour  Intake 7752.76 ml  Output   1390 ml  Net 6362.76 ml    PHYSICAL EXAMINATION: General:  Chronically ill , in moderate respiratory distress  Neuro:  Nonfocal , power 4/5 all 4 extremities , some wasting of hand muscles  HEENT:  no JVD , left earring  Cardiovascular:  S1 and S2 tachycardia  Lungs:  Bilateral basal scattered crackles no rhonchi  Abdomen:  Soft nontender , PEG site clean  Musculoskeletal:  Wasting as well  Skin:  Mottling of the lower extremities , good pulses , cap refill + LABS:  CBC  Recent Labs Lab  02/12/15 1007 02/12/15 1435 02/13/15 0432  WBC 7.9 9.2 8.2  HGB 13.5 11.6* 10.3*  HCT 40.6 36.2* 31.1*  PLT 150 164 153   Coag's No results for input(s): APTT, INR in the last 168 hours. BMET  Recent Labs Lab 02/12/15 1007 02/12/15 1435 02/13/15 0432  NA 140  --  140  K 3.5  --  3.1*  CL 104  --  112*  CO2 28  --  23  BUN 15  --  9  CREATININE 0.76 0.72 0.72  GLUCOSE 91  --  126*   Electrolytes  Recent Labs Lab 02/12/15 1007 02/13/15 0432  CALCIUM 9.2 7.3*  MG  --  1.5*  PHOS  --  1.2*   Sepsis Markers  Recent Labs Lab 02/12/15 1025  LATICACIDVEN 1.88   ABG  Recent Labs Lab 02/12/15 1635 02/12/15 2049  PHART 7.178* 7.350  PCO2ART 62.3* 36.5  PO2ART 101.0* 80.0   Liver Enzymes No results for input(s): AST, ALT, ALKPHOS, BILITOT, ALBUMIN in the last 168 hours. Cardiac Enzymes No results for input(s): TROPONINI, PROBNP in the last 168 hours. Glucose  Recent Labs Lab 02/12/15 1437 02/12/15 1943  GLUCAP 105* 125*    Imaging Dg Chest Port 1 View  02/12/2015   CLINICAL DATA:  Hypoxia  EXAM: PORTABLE CHEST - 1 VIEW  COMPARISON:  February 12, 2015 study obtained earlier in the day  FINDINGS: Endotracheal tube tip is at the carina with the endotracheal tube tip directed toward the right main bronchus. Central catheter tip is at the cavoatrial junction. No pneumothorax. There is airspace consolidation in the right lower lobe. There is mild bibasilar atelectasis. Lungs elsewhere clear. Heart size and pulmonary vascularity are normal. No adenopathy. No bone lesions.  IMPRESSION: Endotracheal tube tip is at the carina with the tip directed toward the right main bronchus. Central catheter tip at cavoatrial junction. No pneumothorax. Right lower lobe airspace consolidation. Mild bibasilar atelectasis.  Critical Value/emergent results were called by telephone at the time of interpretation on 02/12/2015 at 3:59 pm to Ardith Dark, RN, who verbally acknowledged these  results.   Electronically Signed   By: Bretta Bang III M.D.   On: 02/12/2015 15:59   Dg Chest Port 1 View  02/12/2015   CLINICAL DATA:  Short of breath  EXAM: PORTABLE CHEST - 1 VIEW  COMPARISON:  02/27/2014  FINDINGS: Bilateral central and basilar airspace disease. Low volumes. Pleural effusions are not excluded. Cardiac silhouette obscured. No pneumothorax.  IMPRESSION: Bibasilar airspace disease. Bilateral pleural effusions are not excluded.   Electronically Signed   By: Jolaine Click M.D.   On: 02/12/2015 10:27     ASSESSMENT / PLAN:  PULMONARY OETT 6/18 >> A:Acute respiratory failure , hypoxic  Chronic hypoventilation , neuromuscular  Bibasal severe CAP P:  SBTs once off pressors Chest vest  Tid to assist with secretion clearance   CARDIOVASCULAR CVL [facility]  6/18 >> A: Severe sepsis , normal lactate  P:   Levophed gtt- aim for MAP 65, add vasopressin No need for stress dose steroids here Follow CVP for volume status   RENAL A:   at risk AKI Hypokalemia/hypomag, hypophos P:  Replete lytes Monitor urine output with Foley   GASTROINTESTINAL A:  Chronic dysphagia s/p PEG  Start TFs   HEMATOLOGIC A:  No issues  monitor   INFECTIOUS A:  Severe CAP vs aspiration P:   BCx2 6/18 Sputum6/18 Abx:  Ceftriaxone start dae t6/18  azithromycin 6/18 >> Vanc 6/19 >>  ENDOCRINE  A:   no issues   P:    Monitor CBG while npo  NEUROLOGIC A:   myotonic dystrophy P:   RASS goal: 0 Fent gtt Add versed gtt for breakthrough agitation     FAMILY  - Updates:  patient designated GF/ son  as Carl Galvan  - Inter-disciplinary family meet or Palliative Care meeting due by:  6/25    TODAY'S SUMMARY -Acute respiratory failure & septic shock due to bibasal pneumonia and inability to clear secretions  requiring mechanical ventilation    The patient is critically ill with multiple organ systems failure and requires high complexity decision making for assessment and support,  frequent evaluation and titration of therapies, application of advanced monitoring technologies and extensive interpretation of multiple databases. Critical Care Time devoted to patient care services described in this note independent of APP time is 45 minutes.    Cyril Mourning MD. Tonny Bollman. [facility] Pager [phone] If no response call [phone]   02/13/2015, 8:29 AM

## 2015-02-13 NOTE — Progress Notes (Addendum)
NUTRITION NOTE  Brief Nutrition Note  Consult received for enteral/tube feeding initiation and management.  Adult Enteral Nutrition Protocol initiated. Full assessment to follow.  Initiate TF via  with OGT at 25 ml/h and Prostat 30 ml BID on day 1; on day 2, increase to goal rate of 40 ml/h (960 ml per day) to provide 960 kcals, 84 gm protein, 802.5 ml free water daily.  Admitting Dx: SOB (shortness of breath) [R06.02]  Body mass index is 29.62 kg/(m^2). Pt meets criteria for overweight status based on current BMI.  Labs:   Recent Labs Lab 02/12/15 1007 02/12/15 1435 02/13/15 0432  NA 140  --  140  K 3.5  --  3.1*  CL 104  --  112*  CO2 28  --  23  BUN 15  --  9  CREATININE 0.76 0.72 0.72  CALCIUM 9.2  --  7.3*  MG  --   --  1.5*  PHOS  --   --  1.2*  GLUCOSE 91  --  126*      Trenton Gammon, RD, LDN Inpatient Clinical Dietitian Pager # [phone] After hours/weekend pager # [phone]

## 2015-02-13 NOTE — Progress Notes (Signed)
Utilization review completed.  

## 2015-02-13 NOTE — Progress Notes (Signed)
[facility] ADULT ICU REPLACEMENT PROTOCOL FOR AM LAB REPLACEMENT ONLY  The patient does apply for the [facility] Adult ICU Electrolyte Replacment Protocol based on the criteria listed below:   1. Is GFR >/= 40 ml/min? Yes.    Patient's GFR today is >60 2. Is urine output >/= 0.5 ml/kg/hr for the last 6 hours? Yes.   Patient's UOP is 0.5 ml/kg/hr 3. Is BUN < 60 mg/dL? Yes.    Patient's BUN today is 9 4. Abnormal electrolyte(s): K+3.1 Mg 1.5 Phos 1.2 5. Ordered repletion with: protocol 6. If a panic level lab has been reported, has the CCM MD in charge been notified? No..   Physician:  E Jamarea Selner  Cathlean Cower [facility] 02/13/2015 5:26 AM

## 2015-02-13 NOTE — Progress Notes (Signed)
ANTIBIOTIC CONSULT NOTE - INITIAL  Pharmacy Consult for Vancomycin Indication: rule out pneumonia  No Known Allergies  Patient Measurements: Height:  (170.2 cm) Weight: 189 lb 2.5 oz (85.8 kg) IBW/kg (Calculated) : 66.1 Adjusted Body Weight:   Vital Signs: Temp: 99.7 F (37.6 C) (06/19 0811) Temp Source: Oral (06/19 0811) BP: 103/61 mmHg (06/19 0826) Pulse Rate: 95 (06/19 0826) Intake/Output from previous day: 06/18 0701 - 06/19 0700 In: 7752.8 [I.V.:6316.8; IV Piggyback:1436] Out: 1390 [Urine:1390] Intake/Output from this shift:    Labs:  Recent Labs  02/12/15 1007 02/12/15 1435 02/13/15 0432  WBC 7.9 9.2 8.2  HGB 13.5 11.6* 10.3*  PLT 150 164 153  CREATININE 0.76 0.72 0.72   Estimated Creatinine Clearance: 104.1 mL/min (by C-G formula based on Cr of 0.72). No results for input(s): VANCOTROUGH, VANCOPEAK, VANCORANDOM, GENTTROUGH, GENTPEAK, GENTRANDOM, TOBRATROUGH, TOBRAPEAK, TOBRARND, AMIKACINPEAK, AMIKACINTROU, AMIKACIN in the last 72 hours.   Microbiology: Recent Results (from the past 720 hour(s))  MRSA PCR Screening     Status: Abnormal   Collection Time: 02/12/15  1:52 PM  Result Value Ref Range Status   MRSA by PCR RESULT CALLED TO, READ BACK BY AND VERIFIED WITH: (A) NEGATIVE Final    Comment: T.CRITE,RN 02/12/15  BY V.WILKINS        The GeneXpert MRSA Assay (FDA approved for NASAL specimens only), is one component of a comprehensive MRSA colonization surveillance program. It is not intended to diagnose MRSA infection nor to guide or monitor treatment for MRSA infections.     Medical History: Past Medical History  Diagnosis Date  . Muscular dystrophy     Medications:  Prescriptions prior to admission  Medication Sig Dispense Refill Last Dose  . albuterol (PROVENTIL HFA;VENTOLIN HFA) 108 (90 BASE) MCG/ACT inhaler Inhale 1-2 puffs into the lungs every 6 (six) hours as needed for wheezing or shortness of breath.   Past Week at  Unknown time  . ALPRAZolam (XANAX) 0.5 MG tablet Take 0.5 mg by mouth daily as needed for anxiety.   02/11/2015 at Unknown time  . cetirizine (ZYRTEC) 10 MG tablet Take 10 mg by mouth daily.   02/11/2015 at Unknown time  . esomeprazole (NEXIUM) 40 MG packet Take 40 mg by mouth daily.   02/11/2015 at Unknown time  . ipratropium-albuterol (DUONEB) 0.5-2.5 (3) MG/3ML SOLN Take 3 mLs by nebulization every 6 (six) hours.   Past Month at Unknown time  . methylphenidate (RITALIN LA) 20 MG 24 hr capsule Take 20 mg by mouth 2 (two) times daily.   02/11/2015 at Unknown time  . OVER THE COUNTER MEDICATION Place 2 sprays into the nose 2 (two) times daily as needed (Nasal congestion). OTC nasal spray   Past Week at Unknown time  . OXYGEN Inhale into the lungs as needed.   02/11/2015 at Unknown time  . polyethylene glycol (MIRALAX / GLYCOLAX) packet Take 17 g by mouth daily as needed for mild constipation.   Past Week at Unknown time  . pravastatin (PRAVACHOL) 80 MG tablet Take 80 mg by mouth daily.   02/11/2015 at Unknown time  . aspirin 81 MG chewable tablet Chew 1 tablet (81 mg total) by mouth daily. (Patient not taking: Reported on 02/12/2015) 30 tablet 0 Not Taking at Unknown time  . atorvastatin (LIPITOR) 40 MG tablet Take 1 tablet (40 mg total) by mouth daily at 6 PM. (Patient not taking: Reported on 02/12/2015) 30 tablet 0 Not Taking at Unknown time   Scheduled:  . antiseptic  oral rinse  7 mL Mouth Rinse QID  . aspirin  324 mg Oral NOW   Or  . aspirin  300 mg Rectal NOW  . azithromycin  500 mg Intravenous Q24H  . cefTRIAXone (ROCEPHIN)  IV  1 g Intravenous Q24H  . chlorhexidine  15 mL Mouth Rinse BID  . Chlorhexidine Gluconate Cloth  6 each Topical Q0600  . feeding supplement (VITAL HIGH PROTEIN)  1,000 mL Per Tube Q24H  . fentaNYL (SUBLIMAZE) injection  50 mcg Intravenous Once  . heparin  5,000 Units Subcutaneous 3 times per day  . ipratropium-albuterol  3 mL Nebulization Q4H  . magnesium sulfate LVP  250-500 ml  6 g Intravenous Once  . mupirocin ointment  1 application Nasal BID  . pantoprazole (PROTONIX) IV  40 mg Intravenous Daily  . potassium chloride  10 mEq Intravenous Q1 Hr x 4  . sodium phosphate  Dextrose 5% IVPB  30 mmol Intravenous Once   Infusions:  . sodium chloride    . sodium chloride 125 mL/hr at 02/13/15 0224  . fentaNYL infusion INTRAVENOUS 300 mcg/hr (02/13/15 0839)  . midazolam (VERSED) infusion    . norepinephrine (LEVOPHED) Adult infusion 16 mcg/min (02/13/15 0700)  . vasopressin (PITRESSIN) infusion - *FOR SHOCK*     Assessment: 59yo male with history of myotonic dystrophy presents with bibasal pneumonia, hypoxic respiratory failure and septic shock. Pharmacy is consulted to dose vancomycin for pneumonia. Pt is low-grade febrile, WBC wnl, sCr wnl.  Goal of Therapy:  Vancomycin trough level 15-20 mcg/ml  Plan:  Vancomycin 1g IV q8h Continue ceftriaxone 1g IV q24h and azithromycin 500mg  IV q24h Expected duration 7 days with resolution of temperature and/or normalization of WBC Measure antibiotic drug levels at steady state Follow up culture results, renal function, and clinical course  Arlean Hopping. Newman Pies, PharmD Clinical Pharmacist Pager [phone] 02/13/2015,8:47 AM

## 2015-02-14 ENCOUNTER — Inpatient Hospital Stay (HOSPITAL_COMMUNITY): Payer: Medicare Other

## 2015-02-14 DIAGNOSIS — Z789 Other specified health status: Secondary | ICD-10-CM

## 2015-02-14 DIAGNOSIS — G7111 Myotonic muscular dystrophy: Secondary | ICD-10-CM

## 2015-02-14 DIAGNOSIS — J9600 Acute respiratory failure, unspecified whether with hypoxia or hypercapnia: Secondary | ICD-10-CM

## 2015-02-14 DIAGNOSIS — Z978 Presence of other specified devices: Secondary | ICD-10-CM | POA: Insufficient documentation

## 2015-02-14 DIAGNOSIS — R0602 Shortness of breath: Secondary | ICD-10-CM

## 2015-02-14 DIAGNOSIS — J189 Pneumonia, unspecified organism: Secondary | ICD-10-CM | POA: Insufficient documentation

## 2015-02-14 LAB — LEGIONELLA ANTIGEN, URINE

## 2015-02-14 LAB — GLUCOSE, CAPILLARY
GLUCOSE-CAPILLARY: 124 mg/dL — AB (ref 65–99)
GLUCOSE-CAPILLARY: 130 mg/dL — AB (ref 65–99)
GLUCOSE-CAPILLARY: 132 mg/dL — AB (ref 65–99)
GLUCOSE-CAPILLARY: 97 mg/dL (ref 65–99)
Glucose-Capillary: 148 mg/dL — ABNORMAL HIGH (ref 65–99)
Glucose-Capillary: 163 mg/dL — ABNORMAL HIGH (ref 65–99)

## 2015-02-14 LAB — BASIC METABOLIC PANEL
Anion gap: 5 (ref 5–15)
Anion gap: 6 (ref 5–15)
BUN: 8 mg/dL (ref 6–20)
BUN: 5 mg/dL — ABNORMAL LOW (ref 6–20)
CHLORIDE: 109 mmol/L (ref 101–111)
CHLORIDE: 108 mmol/L (ref 101–111)
CO2: 22 mmol/L (ref 22–32)
CO2: 21 mmol/L — ABNORMAL LOW (ref 22–32)
Calcium: 6.8 mg/dL — ABNORMAL LOW (ref 8.9–10.3)
Calcium: 6.7 mg/dL — ABNORMAL LOW (ref 8.9–10.3)
Creatinine, Ser: 0.67 mg/dL (ref 0.61–1.24)
Creatinine, Ser: 0.63 mg/dL (ref 0.61–1.24)
GFR calc Af Amer: >60 mL/min (ref >60)
GFR calc Af Amer: >60 mL/min (ref >60)
GFR calc non Af Amer: >60 mL/min (ref >60)
GFR calc non Af Amer: >60 mL/min (ref >60)
GLUCOSE: 155 mg/dL — AB (ref 65–99)
Glucose, Bld: 154 mg/dL — ABNORMAL HIGH (ref 65–99)
POTASSIUM: 2.8 mmol/L — AB (ref 3.5–5.1)
POTASSIUM: 3.2 mmol/L — AB (ref 3.5–5.1)
SODIUM: 136 mmol/L (ref 135–145)
SODIUM: 135 mmol/L (ref 135–145)

## 2015-02-14 LAB — CORTISOL: CORTISOL PLASMA: 7.4 ug/dL

## 2015-02-14 LAB — CBC
HCT: 28.3 % — ABNORMAL LOW (ref 39.0–52.0)
Hemoglobin: 9.4 g/dL — ABNORMAL LOW (ref 13.0–17.0)
MCH: 30.5 pg (ref 26.0–34.0)
MCHC: 33.2 g/dL (ref 30.0–36.0)
MCV: 91.9 fL (ref 78.0–100.0)
Platelets: 143 10*3/uL — ABNORMAL LOW (ref 150–400)
RBC: 3.08 MIL/uL — ABNORMAL LOW (ref 4.22–5.81)
RDW: 14.6 % (ref 11.5–15.5)
WBC: 8.9 10*3/uL (ref 4.0–10.5)

## 2015-02-14 LAB — MAGNESIUM
MAGNESIUM: 2 mg/dL (ref 1.7–2.4)
Magnesium: 2.4 mg/dL (ref 1.7–2.4)

## 2015-02-14 LAB — URINE CULTURE
CULTURE: NO GROWTH
SPECIAL REQUESTS: NORMAL

## 2015-02-14 LAB — PHOSPHORUS
Phosphorus: <1 mg/dL — CL (ref 2.5–4.6)
Phosphorus: 1.6 mg/dL — ABNORMAL LOW (ref 2.5–4.6)

## 2015-02-14 MED ORDER — DEXTROSE 5 % IV SOLN
1.0000 g | Freq: Three times a day (TID) | INTRAVENOUS | Status: DC
  Administered 2015-02-14 – 2015-02-17 (×10): 1 g via INTRAVENOUS
  Filled 2015-02-14 (×12): qty 1

## 2015-02-14 MED ORDER — VITAL AF 1.2 CAL PO LIQD
1000.0000 mL | ORAL | Status: DC
  Administered 2015-02-14 – 2015-02-16 (×5): 1000 mL
  Filled 2015-02-14 (×8): qty 1000

## 2015-02-14 MED ORDER — POTASSIUM CHLORIDE 10 MEQ/50ML IV SOLN
10.0000 meq | INTRAVENOUS | Status: AC
  Administered 2015-02-14 (×5): 10 meq via INTRAVENOUS
  Filled 2015-02-14 (×5): qty 50

## 2015-02-14 MED ORDER — POTASSIUM PHOSPHATES 15 MMOLE/5ML IV SOLN
30.0000 mmol | Freq: Once | INTRAVENOUS | Status: AC
  Administered 2015-02-14: 30 mmol via INTRAVENOUS
  Filled 2015-02-14: qty 10

## 2015-02-14 MED ORDER — POTASSIUM CHLORIDE 10 MEQ/50ML IV SOLN
INTRAVENOUS | Status: AC
  Administered 2015-02-14: 10 meq
  Filled 2015-02-14: qty 50

## 2015-02-14 MED ORDER — HYDROCORTISONE NA SUCCINATE PF 100 MG IJ SOLR
50.0000 mg | Freq: Four times a day (QID) | INTRAMUSCULAR | Status: DC
  Administered 2015-02-14 – 2015-02-18 (×14): 50 mg via INTRAVENOUS
  Filled 2015-02-14: qty 1
  Filled 2015-02-14 (×2): qty 2
  Filled 2015-02-14: qty 1
  Filled 2015-02-14: qty 2
  Filled 2015-02-14 (×11): qty 1
  Filled 2015-02-14: qty 2
  Filled 2015-02-14 (×4): qty 1

## 2015-02-14 MED ORDER — SODIUM PHOSPHATE 3 MMOLE/ML IV SOLN
30.0000 mmol | Freq: Once | INTRAVENOUS | Status: AC
  Administered 2015-02-14: 30 mmol via INTRAVENOUS
  Filled 2015-02-14: qty 10

## 2015-02-14 NOTE — Progress Notes (Signed)
ANTIBIOTIC CONSULT NOTE - INITIAL  Pharmacy Consult for Fortaz; vancomycin Indication: rule out pneumonia  No Known Allergies Patient Measurements: Height: 5\' 7"  (170.2 cm) Weight: 197 lb 15.6 oz (89.8 kg) IBW/kg (Calculated) : 66.1 Vital Signs: Temp: 100.3 F (37.9 C) (06/20 1133) Temp Source: Oral (06/20 1133) BP: 114/69 mmHg (06/20 1200) Pulse Rate: 85 (06/20 1200) Intake/Output from previous day: 06/19 0701 - 06/20 0700 In: 5586.1 [I.V.:3887.1; NG/GT:500; IV Piggyback:1179] Out: 1005 [Urine:1005] Intake/Output from this shift: Total I/O In: 1170.5 [I.V.:277.5; NG/GT:200; IV Piggyback:693] Out: 400 [Urine:400] Labs:  Recent Labs  02/12/15 1435 02/13/15 0432 02/14/15 0442  WBC 9.2 8.2 8.9  HGB 11.6* 10.3* 9.4*  PLT 164 153 143*  CREATININE 0.72 0.72 0.67   Estimated Creatinine Clearance: 106.3 mL/min (by C-G formula based on Cr of 0.67).  Microbiology: Recent Results (from the past 720 hour(s))  Culture, blood (routine x 2)     Status: None (Preliminary result)   Collection Time: 02/12/15 10:07 AM  Result Value Ref Range Status   Specimen Description BLOOD LEFT HAND  Final   Special Requests BOTTLES DRAWN AEROBIC ONLY 3CC  Final   Culture NO GROWTH 1 DAY  Final   Report Status PENDING  Incomplete  Culture, blood (routine x 2)     Status: None (Preliminary result)   Collection Time: 02/12/15 10:15 AM  Result Value Ref Range Status   Specimen Description BLOOD RIGHT HAND  Final   Special Requests   Final    BOTTLES DRAWN AEROBIC AND ANAEROBIC BLUE 5CC RED 2CC   Culture NO GROWTH 1 DAY  Final   Report Status PENDING  Incomplete  MRSA PCR Screening     Status: Abnormal   Collection Time: 02/12/15  1:52 PM  Result Value Ref Range Status   MRSA by PCR RESULT CALLED TO, READ BACK BY AND VERIFIED WITH: (A) NEGATIVE Final    Comment: T.CRITE,RN 02/12/15 @1747  BY V.WILKINS        The GeneXpert MRSA Assay (FDA approved for NASAL specimens only), is one component  of a comprehensive MRSA colonization surveillance program. It is not intended to diagnose MRSA infection nor to guide or monitor treatment for MRSA infections.   Culture, respiratory (NON-Expectorated)     Status: None (Preliminary result)   Collection Time: 02/12/15  3:25 PM  Result Value Ref Range Status   Specimen Description SPUTUM  Final   Special Requests NONE  Final   Gram Stain   Final    MODERATE WBC PRESENT,BOTH PMN AND MONONUCLEAR FEW SQUAMOUS EPITHELIAL CELLS PRESENT FEW GRAM POSITIVE RODS FEW GRAM POSITIVE COCCI IN PAIRS    Culture   Final    Culture reincubated for better growth Performed at [facility]    Report Status PENDING  Incomplete  Culture, Urine     Status: None   Collection Time: 02/12/15  4:21 PM  Result Value Ref Range Status   Specimen Description URINE, CATHETERIZED  Final   Special Requests Normal  Final   Culture NO GROWTH 2 DAYS  Final   Report Status 02/14/2015 FINAL  Final    Medical History: Past Medical History  Diagnosis Date  . Muscular dystrophy    Assessment: 59 year old male with r/o CAP and worsening infiltrates on 6/20 to change from ceftriaxone to ceftazidime in addition to current vancomycin and azithromycin.   Tmax 100.9, WBC wnl, LA 1.88, SCr 0.67 (stable), estimated CrCl >100 mL/min.   CTX 6/18>>6/20  Elita Quick 6/20 >>  Azith 6/18>> (6/22)  Vanc 6/19>>   6/18 BCx2: sent  6/18 RCx: sent  6/18 UCx: negative 6/18 MRSA PCR: abnormal  Goal of Therapy:  Vancomycin trough level 15-20 mcg/ml Clinical resolution of infection  Plan:  Fortaz 1g IV every 8 hours. Vancomycin 1g IV every 8 hours.  Monitor culture results, clinical status, and renal function.  Vancomycin trough at Css if continuing therapy.   Link Snuffer, PharmD, BCPS Clinical Pharmacist [phone] 02/14/2015,2:13 PM

## 2015-02-14 NOTE — Progress Notes (Signed)
[facility] ADULT ICU REPLACEMENT PROTOCOL FOR AM LAB REPLACEMENT ONLY  The patient does not apply for the [facility] Adult ICU Electrolyte Replacment Protocol based on the criteria listed below:   1. Is GFR >/= 40 ml/min? Yes.    Patient's GFR today is >60 2. Is urine output >/= 0.5 ml/kg/hr for the last 6 hours? No. Patient's UOP is 0.4 ml/kg/hr 3. Is BUN < 60 mg/dL? Yes.    Patient's BUN today is 8 4. Abnormal electrolyte(s): K+2.8 5. Ordered repletion with: NA 6. If a panic level lab has been reported, has the CCM MD in charge been notified? Yes.  .   Physician:  Idalia Needle Hilliard 02/14/2015 5:59 AM

## 2015-02-14 NOTE — Progress Notes (Signed)
Initial Nutrition Assessment   INTERVENTION:   Continue TF via PEG change to Vital AF 1.2 at goal rate of 75 ml/h (1800 ml per day) to provide 2160 kcals, 135 gm protein, 1460 ml free water daily.  NUTRITION DIAGNOSIS:  Inadequate oral intake related to inability to eat, dysphagia as evidenced by NPO status.  GOAL:  Patient will meet greater than or equal to 90% of their needs  MONITOR:  TF tolerance, Labs, Weight trends, Vent status  REASON FOR ASSESSMENT:  Consult Enteral/tube feeding initiation and management  ASSESSMENT:  Patient with myotonic dystrophy, presented on 6/18 with pneumonia, hypoxic respiratory failure, and septic shock. Hx of chronic nocturnal hypoventilation, non compliant with BiPAP, and narcolepsy. PEG in place for chronic dysphagia.  Labs reviewed: phosphorus and potassium are low. Receiving IV replacement. TF has been initiated. Patient is currently receiving Vital High Protein at 40 ml/h (960 ml/day) to provide 960 kcals, 84 gm protein, 803 ml free water daily.   Patient is currently intubated on ventilator support MV: 13.2 L/min Temp (24hrs), Avg:99.6 F (37.6 C), Min:98 F (36.7 C), Max:100.9 F (38.3 C)  Propofol: none  Height:  Ht Readings from Last 1 Encounters:  02/12/15 5\' 7"  (1.702 m)    Weight:  Wt Readings from Last 1 Encounters:  02/14/15 197 lb 15.6 oz (89.8 kg)  02/12/15 186 lbb 1.1 oz (84.4 kg)  Ideal Body Weight:  67.3 kg  Wt Readings from Last 10 Encounters:  02/14/15 197 lb 15.6 oz (89.8 kg)  02/28/14 174 lb 3.2 oz (79.017 kg)    BMI:  29.1  Estimated Nutritional Needs:  Kcal:  2150  Protein:  110-130 gm  Fluid:  2.2 L  Skin:  Reviewed, no issues  Diet Order:  Diet NPO time specified  EDUCATION NEEDS:  No education needs identified at this time   Intake/Output Summary (Last 24 hours) at 02/14/15 1403 Last data filed at 02/14/15 1200  Gross per 24 hour  Intake   4859 ml  Output   1205 ml  Net    3654 ml    Last BM:  Unknown   Joaquin Courts, RD, LDN, CNSC Pager [phone] After Hours Pager [phone]

## 2015-02-14 NOTE — Progress Notes (Signed)
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 and hypoxic respiratory failure and septic shock with normal lactate H/o chronic noct hypoventilation, non compliant with bipap , narcolepsy on ritalin  STUDIES:   SIGNIFICANT EVENTS: 6/18 intubated - designated Carl Galvan & son as POA, Copious secretions  SUBJECTIVE: secretions remain high  VITAL SIGNS: Temp:  [98 F (36.7 C)-100.9 F (38.3 C)] 100.9 F (38.3 C) (06/20 0808) Pulse Rate:  [60-91] 63 (06/20 0814) Resp:  [19-26] 26 (06/20 0814) BP: (82-123)/(47-84) 104/53 mmHg (06/20 0814) SpO2:  [91 %-99 %] 99 % (06/20 0814) FiO2 (%):  [40 %-50 %] 40 % (06/20 0814) Weight:  [89.8 kg (197 lb 15.6 oz)] 89.8 kg (197 lb 15.6 oz) (06/20 0445) HEMODYNAMICS: CVP:  [9 mmHg-12 mmHg] 11 mmHg VENTILATOR SETTINGS: Vent Mode:  [-] PRVC FiO2 (%):  [40 %-50 %] 40 % Set Rate:  [26 bmp] 26 bmp Vt Set:  [520 mL] 520 mL PEEP:  [5 cmH20] 5 cmH20 Plateau Pressure:  [19 cmH20-20 cmH20] 20 cmH20 INTAKE / OUTPUT:  Intake/Output Summary (Last 24 hours) at 02/14/15 0921 Last data filed at 02/14/15 0700  Gross per 24 hour  Intake 5025.2 ml  Output   1005 ml  Net 4020.2 ml    PHYSICAL EXAMINATION: General:  Chronically ill  Neuro:  Nonfocal ,follows commands, pre sedation start, some wasting of hand muscles  HEENT:  JVD Cardiovascular:  S1 and S2 tachycardia  Lungs:  ronchi diffuse bases Abdomen:  Soft nontender , PEG site clean  Musculoskeletal:  Wasting as well  Skin:  Mottling of the lower extremities , good pulses , cap refill + LABS:  CBC  Recent Labs Lab 02/12/15 1435 02/13/15 0432 02/14/15 0442  WBC 9.2 8.2 8.9  HGB 11.6* 10.3* 9.4*  HCT 36.2* 31.1* 28.3*  PLT 164 153  143*   Coag's No results for input(s): APTT, INR in the last 168 hours. BMET  Recent Labs Lab 02/12/15 1007 02/12/15 1435 02/13/15 0432 02/14/15 0442  NA 140  --  140 136  K 3.5  --  3.1* 2.8*  CL 104  --  112* 109  CO2 28  --  23 22  BUN 15  --  9 8  CREATININE 0.76 0.72 0.72 0.67  GLUCOSE 91  --  126* 155*   Electrolytes  Recent Labs Lab 02/12/15 1007 02/13/15 0432 02/13/15 1935 02/14/15 0442  CALCIUM 9.2 7.3*  --  6.8*  MG  --  1.5* 2.5* 2.4  PHOS  --  1.2*  --  <1.0*   Sepsis Markers  Recent Labs Lab 02/12/15 1025  LATICACIDVEN 1.88   ABG  Recent Labs Lab 02/12/15 1635 02/12/15 2049  PHART 7.178* 7.350  PCO2ART 62.3* 36.5  PO2ART 101.0* 80.0   Liver Enzymes No results for input(s): AST, ALT, ALKPHOS, BILITOT, ALBUMIN in the last 168 hours. Cardiac Enzymes No results for input(s): TROPONINI, PROBNP in the last 168 hours. Glucose  Recent Labs Lab 02/12/15 1437 02/12/15 1943 02/13/15 1947 02/14/15 0029 02/14/15 0402 02/14/15 0805  GLUCAP 105* 125* 217* 124* 148* 130*    Imaging Dg Chest Port 1 View  02/14/2015   CLINICAL DATA:  Acute respiratory failure.  EXAM: PORTABLE CHEST - 1 VIEW  COMPARISON:  02/13/2015.  FINDINGS: Endotracheal tube, NG tube, left IJ line in stable position. Mediastinum hilar structures are normal. Heart size normal. Normal pulmonary vascularity. Persistent bibasilar airspace disease noted. No interim change. Small right pleural effusion cannot be excluded. No pneumothorax .  IMPRESSION: 1. Lines and tubes in stable position. 2. Persistent dense bibasilar airspace disease with associated atelectasis.   Electronically Signed   By: Maisie Fus  Register   On: 02/14/2015 07:55     ASSESSMENT / PLAN:  PULMONARY OETT 6/18 >> A:Acute respiratory failure , hypoxic  Chronic hypoventilation , neuromuscular  Bibasal severe CAP R/o edema R/o effusions P:   Secretions ar an issue, no collapse noted, no bronch needed Rate 26  required, MV high greater 15 lit, concern no weaning Consider neg balance Consider [place] rt chest May be bale to ps 10-15  CARDIOVASCULAR CVL [facility]  6/18 >> A: Severe sepsis , normal lactate  P:   Levophed gtt to map 60 Obtain cortisol Tele cvp awaited this am  Vaso to dc when levo at 5 mics Consider echo assessment, LA wnl, no fever,. Remain shock  RENAL A:   at risk AKI Hypokalemia hypophos P:  kphos then repeat labs Need cvp this am, if greater 12, kvo  GASTROINTESTINAL A:  Chronic dysphagia s/p PEG T ft goal ppi  HEMATOLOGIC A:  DV t prev  Sub q hep  INFECTIOUS A:  Severe CAP vs aspiration, clinically worse , infiltrates P:   BCx2 6/18 Sputum6/18 Abx:   Ceftriaxone 6/18>>>6/20 azithromycin 6/18 >>stop date 6/22 Vanc 6/19 >> ceftaz 6/20>>>  Add ceftaz, worsening status  ENDOCRINE  A:   R/o AI P:    Monitor CBG cortisol  NEUROLOGIC A:   myotonic dystrophy, vent dyschrony P:   RASS goal: 0 Fent gtt Versed drip required WUA  FAMILY  - Updates:  patient designated Carl Galvan/ son  as Carl Galvan  - Inter-disciplinary family meet or Palliative Care meeting due by:  6/25    Ccm time 30 min   Carl Galvan. Carl Alias, MD, FACP Pgr: [phone] [facility]

## 2015-02-14 NOTE — Progress Notes (Signed)
eLink Physician-Brief Progress Note Patient Name: Carl Galvan DOB: May 08, 1956 MRN: [id number]   Date of Service  02/14/2015  HPI/Events of Note  K, Phos low  eICU Interventions  Kphos now     Intervention Category Intermediate Interventions: Electrolyte abnormality - evaluation and management  Artavious Trebilcock 02/14/2015, 8:57 PM

## 2015-02-14 NOTE — Progress Notes (Signed)
Echocardiogram 2D Echocardiogram has been performed.  Carl Galvan 02/14/2015, 1:25 PM

## 2015-02-15 ENCOUNTER — Inpatient Hospital Stay (HOSPITAL_COMMUNITY): Payer: Medicare Other

## 2015-02-15 DIAGNOSIS — R6520 Severe sepsis without septic shock: Secondary | ICD-10-CM

## 2015-02-15 LAB — COMPREHENSIVE METABOLIC PANEL
ALK PHOS: 69 U/L (ref 38–126)
ALT: 35 U/L (ref 17–63)
AST: 27 U/L (ref 15–41)
Albumin: 1.7 g/dL — ABNORMAL LOW (ref 3.5–5.0)
Anion gap: 5 (ref 5–15)
BUN: 6 mg/dL (ref 6–20)
CO2: 21 mmol/L — ABNORMAL LOW (ref 22–32)
Calcium: 6.6 mg/dL — ABNORMAL LOW (ref 8.9–10.3)
Chloride: 104 mmol/L (ref 101–111)
Creatinine, Ser: 0.44 mg/dL — ABNORMAL LOW (ref 0.61–1.24)
GFR calc non Af Amer: >60 mL/min (ref >60)
GLUCOSE: 174 mg/dL — AB (ref 65–99)
POTASSIUM: 3.8 mmol/L (ref 3.5–5.1)
Sodium: 130 mmol/L — ABNORMAL LOW (ref 135–145)
TOTAL PROTEIN: 4.9 g/dL — AB (ref 6.5–8.1)
Total Bilirubin: 0.9 mg/dL (ref 0.3–1.2)

## 2015-02-15 LAB — CBC WITH DIFFERENTIAL/PLATELET
BASOS PCT: 0 % (ref 0–1)
Basophils Absolute: 0 10*3/uL (ref 0.0–0.1)
EOS ABS: 0 10*3/uL (ref 0.0–0.7)
EOS PCT: 0 % (ref 0–5)
HCT: 26.9 % — ABNORMAL LOW (ref 39.0–52.0)
Hemoglobin: 9 g/dL — ABNORMAL LOW (ref 13.0–17.0)
LYMPHS ABS: 0.2 10*3/uL — AB (ref 0.7–4.0)
Lymphocytes Relative: 3 % — ABNORMAL LOW (ref 12–46)
MCH: 30.2 pg (ref 26.0–34.0)
MCHC: 33.5 g/dL (ref 30.0–36.0)
MCV: 90.3 fL (ref 78.0–100.0)
Monocytes Absolute: 0.3 10*3/uL (ref 0.1–1.0)
Monocytes Relative: 5 % (ref 3–12)
Neutro Abs: 6.3 10*3/uL (ref 1.7–7.7)
Neutrophils Relative %: 92 % — ABNORMAL HIGH (ref 43–77)
Platelets: 145 10*3/uL — ABNORMAL LOW (ref 150–400)
RBC: 2.98 MIL/uL — AB (ref 4.22–5.81)
RDW: 14.2 % (ref 11.5–15.5)
WBC: 6.8 10*3/uL (ref 4.0–10.5)

## 2015-02-15 LAB — POCT I-STAT 3, ART BLOOD GAS (G3+)
ACID-BASE DEFICIT: 4 mmol/L — AB (ref 0.0–2.0)
Bicarbonate: 22 mEq/L (ref 20.0–24.0)
O2 Saturation: 96 %
TCO2: 23 mmol/L (ref 0–100)
pCO2 arterial: 40.8 mmHg (ref 35.0–45.0)
pH, Arterial: 7.339 — ABNORMAL LOW (ref 7.350–7.450)
pO2, Arterial: 88 mmHg (ref 80.0–100.0)

## 2015-02-15 LAB — URINALYSIS, ROUTINE W REFLEX MICROSCOPIC
BILIRUBIN URINE: NEGATIVE
GLUCOSE, UA: NEGATIVE mg/dL
HGB URINE DIPSTICK: NEGATIVE
Ketones, ur: NEGATIVE mg/dL
Leukocytes, UA: NEGATIVE
Nitrite: NEGATIVE
PH: 6 (ref 5.0–8.0)
Protein, ur: NEGATIVE mg/dL
SPECIFIC GRAVITY, URINE: 1.001 — AB (ref 1.005–1.030)
Urobilinogen, UA: 0.2 mg/dL (ref 0.0–1.0)

## 2015-02-15 LAB — GLUCOSE, CAPILLARY
GLUCOSE-CAPILLARY: 153 mg/dL — AB (ref 65–99)
GLUCOSE-CAPILLARY: 162 mg/dL — AB (ref 65–99)
GLUCOSE-CAPILLARY: 162 mg/dL — AB (ref 65–99)
GLUCOSE-CAPILLARY: 166 mg/dL — AB (ref 65–99)
GLUCOSE-CAPILLARY: 178 mg/dL — AB (ref 65–99)
Glucose-Capillary: 163 mg/dL — ABNORMAL HIGH (ref 65–99)

## 2015-02-15 LAB — URIC ACID: Uric Acid, Serum: 2.5 mg/dL — ABNORMAL LOW (ref 4.4–7.6)

## 2015-02-15 LAB — PHOSPHORUS: Phosphorus: 2 mg/dL — ABNORMAL LOW (ref 2.5–4.6)

## 2015-02-15 LAB — PROTIME-INR
INR: 1.21 (ref 0.00–1.49)
Prothrombin Time: 15.5 seconds — ABNORMAL HIGH (ref 11.6–15.2)

## 2015-02-15 LAB — APTT: aPTT: 39 seconds — ABNORMAL HIGH (ref 24–37)

## 2015-02-15 LAB — OSMOLALITY: Osmolality: 289 mOsm/kg (ref 275–300)

## 2015-02-15 LAB — OSMOLALITY, URINE: OSMOLALITY UR: 67 mosm/kg — AB (ref 390–1090)

## 2015-02-15 LAB — SODIUM, URINE, RANDOM: Sodium, Ur: <10 mmol/L

## 2015-02-15 MED ORDER — SODIUM PHOSPHATE 3 MMOLE/ML IV SOLN
10.0000 mmol | Freq: Once | INTRAVENOUS | Status: AC
  Administered 2015-02-15: 10 mmol via INTRAVENOUS
  Filled 2015-02-15 (×2): qty 3.33

## 2015-02-15 NOTE — Progress Notes (Signed)
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 and hypoxic respiratory failure and septic shock with normal lactate H/o chronic noct hypoventilation, non compliant with bipap , narcolepsy on ritalin  STUDIES:  6/20 echo- 55%, pa 37  SIGNIFICANT EVENTS: 6/18 intubated - designated GF & son as POA, Copious secretions 6/19- pressors remained  SUBJECTIVE: pressors better  VITAL SIGNS: Temp:  [98.8 F (37.1 C)-101.9 F (38.8 C)] 99 F (37.2 C) (06/21 0809) Pulse Rate:  [59-88] 63 (06/21 0821) Resp:  [26] 26 (06/21 0821) BP: (86-130)/(53-82) 97/53 mmHg (06/21 0821) SpO2:  [93 %-99 %] 99 % (06/21 0821) FiO2 (%):  [40 %] 40 % (06/21 0821) Weight:  [92.7 kg (204 lb 5.9 oz)] 92.7 kg (204 lb 5.9 oz) (06/21 0400) HEMODYNAMICS: CVP:  [9 mmHg-10 mmHg] 10 mmHg VENTILATOR SETTINGS: Vent Mode:  [-] PRVC FiO2 (%):  [40 %] 40 % Set Rate:  [26 bmp] 26 bmp Vt Set:  [520 mL] 520 mL PEEP:  [5 cmH20] 5 cmH20 Plateau Pressure:  [21 cmH20-24 cmH20] 21 cmH20 INTAKE / OUTPUT:  Intake/Output Summary (Last 24 hours) at 02/15/15 0930 Last data filed at 02/15/15 0700  Gross per 24 hour  Intake 4292.39 ml  Output   2230 ml  Net 2062.39 ml    PHYSICAL EXAMINATION: General:  Chronically ill  Neuro:  Nonfocal , rass deep this am -4 HEENT:  JVD Cardiovascular:  S1 and S2 RRR Lungs:  CTA anterior, coarse Abdomen:  Soft nontender , PEG site clean  Musculoskeletal:  Wasting Skin: no rash  LABS:  CBC  Recent Labs Lab 02/13/15 0432 02/14/15 0442 02/15/15 0511  WBC 8.2 8.9 6.8  HGB 10.3* 9.4* 9.0*  HCT 31.1* 28.3* 26.9*  PLT 153 143* 145*   Coag's No results for input(s): APTT, INR in the last 168 hours. BMET  Recent  Labs Lab 02/14/15 0442 02/14/15 1952 02/15/15 0511  NA 136 135 130*  K 2.8* 3.2* 3.8  CL 109 108 104  CO2 22 21* 21*  BUN 8 5* 6  CREATININE 0.67 0.63 0.44*  GLUCOSE 155* 154* 174*   Electrolytes  Recent Labs Lab 02/13/15 0432 02/13/15 1935 02/14/15 0442 02/14/15 1952 02/15/15 0511  CALCIUM 7.3*  --  6.8* 6.7* 6.6*  MG 1.5* 2.5* 2.4 2.0  --   PHOS 1.2*  --  <1.0* 1.6*  --    Sepsis Markers  Recent Labs Lab 02/12/15 1025  LATICACIDVEN 1.88   ABG  Recent Labs Lab 02/12/15 1635 02/12/15 2049  PHART 7.178* 7.350  PCO2ART 62.3* 36.5  PO2ART 101.0* 80.0   Liver Enzymes  Recent Labs Lab 02/15/15 0511  AST 27  ALT 35  ALKPHOS 69  BILITOT 0.9  ALBUMIN 1.7*   Cardiac Enzymes No results for input(s): TROPONINI, PROBNP in the last 168 hours. Glucose  Recent Labs Lab 02/14/15 1131 02/14/15 1559 02/14/15 2019 02/15/15 0036 02/15/15 0407 02/15/15 0805  GLUCAP 163* 97 132* 166* 162* 153*    Imaging Dg Chest Port 1 View  02/15/2015   CLINICAL DATA:  Pneumonia.  EXAM: PORTABLE CHEST - 1 VIEW  COMPARISON:  02/14/2015.  FINDINGS: Endotracheal tube, left IJ line, NG tube in stable position. Heart size stable. Persistent dense bilateral airspace disease with  basilar atelectasis. Progressive right pleural effusion. No pneumothorax.  IMPRESSION: 1. Lines and tubes in stable position. 2. Persistent dense bilateral pulmonary infiltrates and/ or edema. 3. Progressive right pleural effusion.   Electronically Signed   By: Maisie Fus  Register   On: 02/15/2015 07:20     ASSESSMENT / PLAN:  PULMONARY OETT 6/18 >> A:Acute respiratory failure , hypoxic  Chronic hypoventilation , neuromuscular  Bibasal severe CAP R/o edema R/o effusions ARDS Likely P:   Follow secretion status pcxr may be effusion increasing vs atx, may need bronch Will [place] chest first, if effusion , would tap as pressors remain Rate to 20 then repeat abg, start ARD officially protocol likely for  WUA then SBt  CARDIOVASCULAR CVL [facility]  6/18 >> A: Severe sepsis , normal lactate , residual pressors remain, Adrenal insuff P:  Stress steroids helped,. Keep Dc vaso as on levo Echo reviewed no cvp, he did well with pos balance, get cvp  RENAL A:   Hyponatremia (favor dilution) P:  cvp kvo Lasix low threhsol UA, urine na, osm, serum osm  GASTROINTESTINAL A:  Chronic dysphagia s/p PEG T ft goal ppi  HEMATOLOGIC A:  DV t prev  Sub q hep  INFECTIOUS A:  Severe CAP vs aspiration, clinically worse , infiltrates, r/o effusion, had fever P:   BCx2 6/18 Sputum6/18 Abx:   Ceftriaxone 6/18>>>6/20 azithromycin 6/18 >>stop date 6/22 Vanc 6/19 >> ceftaz 6/20>>>  Will [place] chest, may need thora/ chest tube  ENDOCRINE  A:   AI P:    Monitor CBG Stress roids  NEUROLOGIC A:   myotonic dystrophy, vent dyschrony, deep rass this am  P:   RASS goal: 0 Fent gtt, off Versed drip off WUA If not improved, needs CT head  FAMILY  - Updates:  patient designated GF/ son  as [name]  - Inter-disciplinary family meet or Palliative Care meeting due by:  6/25  Ccm time 30 min   Mcarthur Rossetti. Tyson Alias, MD, FACP Pgr: [phone]  [facility] Care

## 2015-02-15 NOTE — Procedures (Signed)
[place] chest   1. Small effusion left, dense infiltrate 2. Rtm sall to moderate effusion , unlcear if loculated  For Ct chest  Mcarthur Rossetti. Tyson Alias, MD, FACP Pgr: [phone] [facility]

## 2015-02-15 NOTE — ED Provider Notes (Signed)
CSN: [id number]     Arrival date & time 02/12/15  0933 History   First MD Initiated Contact with Patient 02/12/15 [phone]     Chief Complaint  Patient presents with  . Fever    POSSIBLE SEPSIS      HPI  Impression presents for evaluation of complaint of shortness of breath. A history of muscular dystrophy. He has been more short of breath with cough and fever for the last several days at home. He does wear 3-4 L of oxygen "as needed". Cough productive of clear to white sputum. Complains worsening dyspnea over the last 24 hours.  Past Medical History  Diagnosis Date  . Muscular dystrophy    Past Surgical History  Procedure Laterality Date  . Cholecystectomy     History reviewed. No pertinent family history. History  Substance Use Topics  . Smoking status: Never Smoker   . Smokeless tobacco: Not on file  . Alcohol Use: Yes     Comment: occassionally    Review of Systems  Constitutional: Positive for fever, chills, diaphoresis and fatigue. Negative for appetite change.  HENT: Negative for mouth sores, sore throat and trouble swallowing.   Eyes: Negative for visual disturbance.  Respiratory: Positive for cough and shortness of breath. Negative for chest tightness and wheezing.   Cardiovascular: Positive for chest pain.  Gastrointestinal: Negative for nausea, vomiting, abdominal pain, diarrhea and abdominal distention.  Endocrine: Negative for polydipsia, polyphagia and polyuria.  Genitourinary: Negative for dysuria, frequency and hematuria.  Musculoskeletal: Negative for gait problem.  Skin: Negative for color change, pallor and rash.  Neurological: Positive for weakness. Negative for dizziness, syncope, light-headedness and headaches.  Hematological: Does not bruise/bleed easily.  Psychiatric/Behavioral: Negative for behavioral problems and confusion.      Allergies  Review of patient's allergies indicates no known allergies.  Home Medications   Prior to Admission  medications   Medication Sig Start Date End Date Taking? Authorizing Provider  albuterol (PROVENTIL HFA;VENTOLIN HFA) 108 (90 BASE) MCG/ACT inhaler Inhale 1-2 puffs into the lungs every 6 (six) hours as needed for wheezing or shortness of breath.   Yes Historical Provider, MD  ALPRAZolam Prudy Feeler) 0.5 MG tablet Take 0.5 mg by mouth daily as needed for anxiety.   Yes Historical Provider, MD  cetirizine (ZYRTEC) 10 MG tablet Take 10 mg by mouth daily.   Yes Historical Provider, MD  esomeprazole (NEXIUM) 40 MG packet Take 40 mg by mouth daily.   Yes Historical Provider, MD  ipratropium-albuterol (DUONEB) 0.5-2.5 (3) MG/3ML SOLN Take 3 mLs by nebulization every 6 (six) hours.   Yes Historical Provider, MD  methylphenidate (RITALIN LA) 20 MG 24 hr capsule Take 20 mg by mouth 2 (two) times daily.   Yes Historical Provider, MD  OVER THE COUNTER MEDICATION Place 2 sprays into the nose 2 (two) times daily as needed (Nasal congestion). OTC nasal spray   Yes Historical Provider, MD  OXYGEN Inhale into the lungs as needed.   Yes Historical Provider, MD  polyethylene glycol (MIRALAX / GLYCOLAX) packet Take 17 g by mouth daily as needed for mild constipation.   Yes Historical Provider, MD  pravastatin (PRAVACHOL) 80 MG tablet Take 80 mg by mouth daily.   Yes Historical Provider, MD  aspirin 81 MG chewable tablet Chew 1 tablet (81 mg total) by mouth daily. Patient not taking: Reported on 02/12/2015 02/28/14   Joanna Puff, MD  atorvastatin (LIPITOR) 40 MG tablet Take 1 tablet (40 mg total) by mouth daily at  6 PM. Patient not taking: Reported on 02/12/2015 02/28/14   Joanna Puff, MD   BP 108/64 mmHg  Pulse 63  Temp(Src) 98.1 F (36.7 C) (Oral)  Resp 20  Ht  (1.702 m)  Wt 204 lb 5.9 oz (92.7 kg)  BMI 32.00 kg/m2  SpO2 93% Physical Exam  Constitutional: He is oriented to person, place, and time. He has a sickly appearance. He appears distressed.  Somewhat chronically ill-appearing male in mild  respiratory distress tachypneic.  HENT:  Head: Normocephalic.  Eyes: Conjunctivae are normal. Pupils are equal, round, and reactive to light. No scleral icterus.  Neck: Normal range of motion. Neck supple. No thyromegaly present.  Cardiovascular: Normal rate and regular rhythm.  Exam reveals no gallop and no friction rub.   No murmur heard. Pulmonary/Chest: Accessory muscle usage present. No respiratory distress. He has no wheezes. He has rhonchi. He has no rales.  A few scattered rhonchi and prolongation.  Abdominal: Soft. Bowel sounds are normal. He exhibits no distension. There is no tenderness. There is no rebound.  Musculoskeletal: Normal range of motion.  Neurological: He is alert and oriented to person, place, and time.  Skin: Skin is warm and dry. No rash noted.  Psychiatric: He has a normal mood and affect. His behavior is normal.    ED Course  Procedures (including critical care time) Labs Review Labs Reviewed  MRSA PCR SCREENING - Abnormal; Notable for the following:    MRSA by PCR RESULT CALLED TO, READ BACK BY AND VERIFIED WITH: (*)    All other components within normal limits  CBC WITH DIFFERENTIAL/PLATELET - Abnormal; Notable for the following:    Neutrophils Relative % 86 (*)    Lymphocytes Relative 7 (*)    Lymphs Abs 0.5 (*)    All other components within normal limits  CBC - Abnormal; Notable for the following:    RBC 3.79 (*)    Hemoglobin 11.6 (*)    HCT 36.2 (*)    All other components within normal limits  BRAIN NATRIURETIC PEPTIDE - Abnormal; Notable for the following:    B Natriuretic Peptide 127.8 (*)    All other components within normal limits  GLUCOSE, CAPILLARY - Abnormal; Notable for the following:    Glucose-Capillary 105 (*)    All other components within normal limits  CBC - Abnormal; Notable for the following:    RBC 3.35 (*)    Hemoglobin 10.3 (*)    HCT 31.1 (*)    All other components within normal limits  BASIC METABOLIC PANEL -  Abnormal; Notable for the following:    Potassium 3.1 (*)    Chloride 112 (*)    Glucose, Bld 126 (*)    Calcium 7.3 (*)    All other components within normal limits  MAGNESIUM - Abnormal; Notable for the following:    Magnesium 1.5 (*)    All other components within normal limits  PHOSPHORUS - Abnormal; Notable for the following:    Phosphorus 1.2 (*)    All other components within normal limits  GLUCOSE, CAPILLARY - Abnormal; Notable for the following:    Glucose-Capillary 125 (*)    All other components within normal limits  MAGNESIUM - Abnormal; Notable for the following:    Magnesium 2.5 (*)    All other components within normal limits  BASIC METABOLIC PANEL - Abnormal; Notable for the following:    Potassium 2.8 (*)    Glucose, Bld 155 (*)  Calcium 6.8 (*)    All other components within normal limits  CBC - Abnormal; Notable for the following:    RBC 3.08 (*)    Hemoglobin 9.4 (*)    HCT 28.3 (*)    Platelets 143 (*)    All other components within normal limits  PHOSPHORUS - Abnormal; Notable for the following:    Phosphorus <1.0 (*)    All other components within normal limits  GLUCOSE, CAPILLARY - Abnormal; Notable for the following:    Glucose-Capillary 217 (*)    All other components within normal limits  GLUCOSE, CAPILLARY - Abnormal; Notable for the following:    Glucose-Capillary 124 (*)    All other components within normal limits  GLUCOSE, CAPILLARY - Abnormal; Notable for the following:    Glucose-Capillary 148 (*)    All other components within normal limits  GLUCOSE, CAPILLARY - Abnormal; Notable for the following:    Glucose-Capillary 130 (*)    All other components within normal limits  BASIC METABOLIC PANEL - Abnormal; Notable for the following:    Potassium 3.2 (*)    CO2 21 (*)    Glucose, Bld 154 (*)    BUN 5 (*)    Calcium 6.7 (*)    All other components within normal limits  PHOSPHORUS - Abnormal; Notable for the following:    Phosphorus  1.6 (*)    All other components within normal limits  GLUCOSE, CAPILLARY - Abnormal; Notable for the following:    Glucose-Capillary 163 (*)    All other components within normal limits  COMPREHENSIVE METABOLIC PANEL - Abnormal; Notable for the following:    Sodium 130 (*)    CO2 21 (*)    Glucose, Bld 174 (*)    Creatinine, Ser 0.44 (*)    Calcium 6.6 (*)    Total Protein 4.9 (*)    Albumin 1.7 (*)    All other components within normal limits  CBC WITH DIFFERENTIAL/PLATELET - Abnormal; Notable for the following:    RBC 2.98 (*)    Hemoglobin 9.0 (*)    HCT 26.9 (*)    Platelets 145 (*)    Neutrophils Relative % 92 (*)    Lymphocytes Relative 3 (*)    Lymphs Abs 0.2 (*)    All other components within normal limits  GLUCOSE, CAPILLARY - Abnormal; Notable for the following:    Glucose-Capillary 132 (*)    All other components within normal limits  GLUCOSE, CAPILLARY - Abnormal; Notable for the following:    Glucose-Capillary 166 (*)    All other components within normal limits  GLUCOSE, CAPILLARY - Abnormal; Notable for the following:    Glucose-Capillary 162 (*)    All other components within normal limits  GLUCOSE, CAPILLARY - Abnormal; Notable for the following:    Glucose-Capillary 153 (*)    All other components within normal limits  URINALYSIS, ROUTINE W REFLEX MICROSCOPIC (NOT AT [facility]) - Abnormal; Notable for the following:    Specific Gravity, Urine 1.001 (*)    All other components within normal limits  OSMOLALITY, URINE - Abnormal; Notable for the following:    Osmolality, Ur 67 (*)    All other components within normal limits  URIC ACID - Abnormal; Notable for the following:    Uric Acid, Serum 2.5 (*)    All other components within normal limits  PHOSPHORUS - Abnormal; Notable for the following:    Phosphorus 2.0 (*)    All other components within normal  limits  APTT - Abnormal; Notable for the following:    aPTT 39 (*)    All other components within normal  limits  PROTIME-INR - Abnormal; Notable for the following:    Prothrombin Time 15.5 (*)    All other components within normal limits  GLUCOSE, CAPILLARY - Abnormal; Notable for the following:    Glucose-Capillary 178 (*)    All other components within normal limits  GLUCOSE, CAPILLARY - Abnormal; Notable for the following:    Glucose-Capillary 163 (*)    All other components within normal limits  GLUCOSE, CAPILLARY - Abnormal; Notable for the following:    Glucose-Capillary 162 (*)    All other components within normal limits  POCT I-STAT 3, ART BLOOD GAS (G3+) - Abnormal; Notable for the following:    pH, Arterial 7.178 (*)    pCO2 arterial 62.3 (*)    pO2, Arterial 101.0 (*)    Acid-base deficit 6.0 (*)    All other components within normal limits  POCT I-STAT 3, ART BLOOD GAS (G3+) - Abnormal; Notable for the following:    Acid-base deficit 5.0 (*)    All other components within normal limits  POCT I-STAT 3, ART BLOOD GAS (G3+) - Abnormal; Notable for the following:    pH, Arterial 7.339 (*)    Acid-base deficit 4.0 (*)    All other components within normal limits  CULTURE, BLOOD (ROUTINE X 2)  CULTURE, BLOOD (ROUTINE X 2)  CULTURE, RESPIRATORY (NON-EXPECTORATED)  URINE CULTURE  BASIC METABOLIC PANEL  CREATININE, SERUM  STREP PNEUMONIAE URINARY ANTIGEN  LEGIONELLA ANTIGEN, URINE  MAGNESIUM  CORTISOL  MAGNESIUM  GLUCOSE, CAPILLARY  OSMOLALITY  SODIUM, URINE, RANDOM  BLOOD GAS, ARTERIAL  BASIC METABOLIC PANEL  CBC WITH DIFFERENTIAL/PLATELET  VANCOMYCIN, TROUGH  I-STAT CG4 LACTIC ACID, ED    Imaging Review Ct Head Wo Contrast  02/15/2015   CLINICAL DATA:  Not waking up from anesthesia  EXAM: CT HEAD WITHOUT CONTRAST  TECHNIQUE: Contiguous axial images were obtained from the base of the skull through the vertex without intravenous contrast.  COMPARISON:  02/27/2014  FINDINGS: No acute cortical infarct, hemorrhage, or mass lesion ispresent. Ventricles are of normal  size. No significant extra-axial fluid collection is present. The paranasal sinuses are clear. There is partial opacification of the mastoid air cells. The osseous skull is intact. The calvarium appears intact.  IMPRESSION: 1. No acute intracranial abnormalities. 2. Bilateral mastoid air cell opacification.   Electronically Signed   By: Signa Kell M.D.   On: 02/15/2015 16:40   Ct Chest Wo Contrast  02/15/2015   CLINICAL DATA:  Pneumonia. Patient on ventilation. Small left effusion and dense infiltrate. Small to moderate right effusion unclear if loculated.  EXAM: CT CHEST WITHOUT CONTRAST  TECHNIQUE: Multidetector CT imaging of the chest was performed following the standard protocol without IV contrast.  COMPARISON:  Chest x-ray 02/15/2015  FINDINGS: Left IJ central venous catheter has tip at the cavoatrial junction. Endotracheal tube appears in adequate position. Enteric tube courses into the stomach.  There are small bilateral pleural effusions right greater than left with moderate bibasilar consolidation some of which likely represents compressive atelectasis and some likely due to infection. Some pleural fluid extends into the right major fissure. There is a patchy bilateral airspace process likely multifocal pneumonia. There are a few bilateral calcified granulomas over the lung bases. Mild AP narrowing of the trachea at the carina.  Heart is normal size. Minimal calcified plaque over the left main  and left anterior descending coronary arteries. No definite hilar, mediastinal or axillary adenopathy. Remaining mediastinal structures are within normal. Suggestion of minimal left-sided gynecomastia.  Images through the upper abdomen demonstrate no definite focal abnormality. Minimal degenerative change of the spine.  IMPRESSION: Bilateral multifocal airspace process compatible with a multifocal pneumonia. Small bilateral pleural effusions right greater than left with moderate bibasilar consolidation some of  which is likely compressive atelectasis and some due to infection. Mild extension of the right effusion into the major fissure.  Tubes and lines as described.  Mild atherosclerotic coronary artery disease.  Probable minimal left sided gynecomastia.   Electronically Signed   By: Elberta Fortis M.D.   On: 02/15/2015 17:06   Dg Chest Port 1 View  02/15/2015   CLINICAL DATA:  Pneumonia.  EXAM: PORTABLE CHEST - 1 VIEW  COMPARISON:  02/14/2015.  FINDINGS: Endotracheal tube, left IJ line, NG tube in stable position. Heart size stable. Persistent dense bilateral airspace disease with basilar atelectasis. Progressive right pleural effusion. No pneumothorax.  IMPRESSION: 1. Lines and tubes in stable position. 2. Persistent dense bilateral pulmonary infiltrates and/ or edema. 3. Progressive right pleural effusion.   Electronically Signed   By: Maisie Fus  Register   On: 02/15/2015 07:20   Dg Chest Port 1 View  02/14/2015   CLINICAL DATA:  Acute respiratory failure.  EXAM: PORTABLE CHEST - 1 VIEW  COMPARISON:  02/13/2015.  FINDINGS: Endotracheal tube, NG tube, left IJ line in stable position. Mediastinum hilar structures are normal. Heart size normal. Normal pulmonary vascularity. Persistent bibasilar airspace disease noted. No interim change. Small right pleural effusion cannot be excluded. No pneumothorax .  IMPRESSION: 1. Lines and tubes in stable position. 2. Persistent dense bibasilar airspace disease with associated atelectasis.   Electronically Signed   By: Maisie Fus  Register   On: 02/14/2015 07:55     EKG Interpretation   Date/Time:  Saturday February 12 2015 09:35:30 EDT Ventricular Rate:  108 PR Interval:  141 QRS Duration: 107 QT Interval:  372 QTC Calculation: 499 R Axis:   -37 Text Interpretation:  Ectopic atrial tachycardia, unifocal Incomplete RBBB  and LAFB Probable RVH w/ secondary repol abnormality ST elevation suggests  acute pericarditis Borderline prolonged QT interval Baseline wander in  lead(s)  V1 ED PHYSICIAN INTERPRETATION AVAILABLE IN [facility]  Confirmed by TEST, Record ([id number]) on 02/13/2015 11:15:02 AM      MDM   Final diagnoses:  SOB (shortness of breath)    Patient with increasing oxygen demands. X-ray shows no infiltrates. Cultures obtained. Given antibiotics IV. I discussed the case with critical care patient's blood pressures have deteriorated his oxygen demands worker breathing have worsened. Patient was intolerant of attempt at bedside placement of central line by critical care. Intensivist requested patient be transferred immediately ICU for intubation sedation and placement of central venous catheter.    Rolland Porter, MD 02/15/15 [phone]

## 2015-02-15 NOTE — Progress Notes (Signed)
Patient taken to CT on vent with RN. Patient stable throughout procedure and transport without any complications. RT will continue to monitor patient.

## 2015-02-16 ENCOUNTER — Inpatient Hospital Stay (HOSPITAL_COMMUNITY): Payer: Medicare Other

## 2015-02-16 LAB — CBC WITH DIFFERENTIAL/PLATELET
Basophils Absolute: 0 10*3/uL (ref 0.0–0.1)
Basophils Relative: 0 % (ref 0–1)
Eosinophils Absolute: 0 10*3/uL (ref 0.0–0.7)
Eosinophils Relative: 0 % (ref 0–5)
HCT: 30.6 % — ABNORMAL LOW (ref 39.0–52.0)
Hemoglobin: 10.1 g/dL — ABNORMAL LOW (ref 13.0–17.0)
Lymphocytes Relative: 6 % — ABNORMAL LOW (ref 12–46)
Lymphs Abs: 0.3 10*3/uL — ABNORMAL LOW (ref 0.7–4.0)
MCH: 30.8 pg (ref 26.0–34.0)
MCHC: 33 g/dL (ref 30.0–36.0)
MCV: 93.3 fL (ref 78.0–100.0)
MONO ABS: 0.5 10*3/uL (ref 0.1–1.0)
Monocytes Relative: 8 % (ref 3–12)
Neutro Abs: 4.8 10*3/uL (ref 1.7–7.7)
Neutrophils Relative %: 86 % — ABNORMAL HIGH (ref 43–77)
Platelets: 207 10*3/uL (ref 150–400)
RBC: 3.28 MIL/uL — ABNORMAL LOW (ref 4.22–5.81)
RDW: 14.7 % (ref 11.5–15.5)
WBC: 5.6 10*3/uL (ref 4.0–10.5)

## 2015-02-16 LAB — GLUCOSE, CAPILLARY
GLUCOSE-CAPILLARY: 144 mg/dL — AB (ref 65–99)
GLUCOSE-CAPILLARY: 157 mg/dL — AB (ref 65–99)
GLUCOSE-CAPILLARY: 196 mg/dL — AB (ref 65–99)
GLUCOSE-CAPILLARY: 180 mg/dL — AB (ref 65–99)
Glucose-Capillary: 142 mg/dL — ABNORMAL HIGH (ref 65–99)
Glucose-Capillary: 147 mg/dL — ABNORMAL HIGH (ref 65–99)

## 2015-02-16 LAB — BASIC METABOLIC PANEL
ANION GAP: 7 (ref 5–15)
Anion gap: 4 — ABNORMAL LOW (ref 5–15)
BUN: 10 mg/dL (ref 6–20)
BUN: 16 mg/dL (ref 6–20)
CO2: 28 mmol/L (ref 22–32)
CO2: 29 mmol/L (ref 22–32)
Calcium: 7.9 mg/dL — ABNORMAL LOW (ref 8.9–10.3)
Calcium: 7.8 mg/dL — ABNORMAL LOW (ref 8.9–10.3)
Chloride: 114 mmol/L — ABNORMAL HIGH (ref 101–111)
Chloride: 117 mmol/L — ABNORMAL HIGH (ref 101–111)
Creatinine, Ser: 0.55 mg/dL — ABNORMAL LOW (ref 0.61–1.24)
Creatinine, Ser: 0.58 mg/dL — ABNORMAL LOW (ref 0.61–1.24)
GFR calc Af Amer: >60 mL/min (ref >60)
GFR calc Af Amer: >60 mL/min (ref >60)
GFR calc non Af Amer: >60 mL/min (ref >60)
GFR calc non Af Amer: >60 mL/min (ref >60)
GLUCOSE: 232 mg/dL — AB (ref 65–99)
Glucose, Bld: 172 mg/dL — ABNORMAL HIGH (ref 65–99)
POTASSIUM: 4 mmol/L (ref 3.5–5.1)
POTASSIUM: 3.5 mmol/L (ref 3.5–5.1)
SODIUM: 150 mmol/L — AB (ref 135–145)
Sodium: 149 mmol/L — ABNORMAL HIGH (ref 135–145)

## 2015-02-16 LAB — VANCOMYCIN, TROUGH: Vancomycin Tr: 26 ug/mL (ref 10.0–20.0)

## 2015-02-16 MED ORDER — RISPERIDONE 1 MG/ML PO SOLN
1.0000 mg | Freq: Two times a day (BID) | ORAL | Status: DC
  Administered 2015-02-16 (×2): 1 mg via ORAL
  Filled 2015-02-16 (×5): qty 1

## 2015-02-16 MED ORDER — POLYETHYLENE GLYCOL 3350 17 G PO PACK
17.0000 g | PACK | Freq: Every day | ORAL | Status: DC
  Administered 2015-02-16 – 2015-02-17 (×2): 17 g via ORAL
  Filled 2015-02-16 (×3): qty 1

## 2015-02-16 MED ORDER — VANCOMYCIN HCL 10 G IV SOLR
1250.0000 mg | Freq: Two times a day (BID) | INTRAVENOUS | Status: DC
  Filled 2015-02-16: qty 1250

## 2015-02-16 MED ORDER — INSULIN ASPART 100 UNIT/ML ~~LOC~~ SOLN
0.0000 [IU] | SUBCUTANEOUS | Status: DC
  Administered 2015-02-16 (×2): 3 [IU] via SUBCUTANEOUS
  Administered 2015-02-17: 2 [IU] via SUBCUTANEOUS
  Administered 2015-02-17: 3 [IU] via SUBCUTANEOUS
  Administered 2015-02-17 (×3): 2 [IU] via SUBCUTANEOUS
  Administered 2015-02-17: 3 [IU] via SUBCUTANEOUS
  Administered 2015-02-18 – 2015-02-19 (×6): 2 [IU] via SUBCUTANEOUS

## 2015-02-16 MED ORDER — DOCUSATE SODIUM 50 MG/5ML PO LIQD
100.0000 mg | Freq: Two times a day (BID) | ORAL | Status: DC
  Administered 2015-02-16 – 2015-02-17 (×3): 100 mg via ORAL
  Filled 2015-02-16 (×6): qty 10

## 2015-02-16 NOTE — Progress Notes (Signed)
eLink Physician-Brief Progress Note Patient Name: DORAIN CICOTTE DOB: 1956-07-24 MRN: [id number]   Date of Service  02/16/2015  HPI/Events of Note  hyperglycemia  eICU Interventions  ssi     Intervention Category Intermediate Interventions: Hyperglycemia - evaluation and treatment  MCQUAID, DOUGLAS 02/16/2015, 5:24 PM

## 2015-02-16 NOTE — Progress Notes (Signed)
cbg of 196 called to elink.  Awaiting orders.

## 2015-02-16 NOTE — Progress Notes (Signed)
Wasted 38 ml versed gtt in sink.  Drip expired before used.  Barbaraann Faster, RN witnessed waste.

## 2015-02-16 NOTE — Sedation Documentation (Signed)
CRITICAL VALUE ALERT  Critical value received:Vancomycin Trough 26  Date of notification: 02/16/15  Time of notification: 0906  Critical value read back: yes  Nurse who received alert: Burnard Bunting, RN  MD notified: Link Snuffer, PhD/Dr Micah Flesher  Time: [phone]

## 2015-02-16 NOTE — Progress Notes (Signed)
ANTIBIOTIC CONSULT NOTE - FOLLOW UP  Pharmacy Consult for vancomycin; ceftazidime Indication: pneumonia  No Known Allergies  Patient Measurements: Height: 5\' 7"  (170.2 cm) Weight: 199 lb 1.2 oz (90.3 kg) IBW/kg (Calculated) : 66.1 Vital Signs: Temp: 97.8 F (36.6 C) (06/22 0752) Temp Source: Oral (06/22 0752) BP: 98/56 mmHg (06/22 1030) Pulse Rate: 67 (06/22 1030) Intake/Output from previous day: 06/21 0701 - 06/22 0700 In: 2601.5 [I.V.:471.5; NG/GT:1830; IV Piggyback:300] Out: 5225 [Urine:5225] Intake/Output from this shift: Total I/O In: 276.1 [I.V.:51.1; NG/GT:225] Out: 425 [Urine:425]  Labs:  Recent Labs  02/14/15 0442 02/14/15 1952 02/15/15 0511 02/16/15 0550  WBC 8.9  --  6.8 5.6  HGB 9.4*  --  9.0* 10.1*  PLT 143*  --  145* 207  CREATININE 0.67 0.63 0.44* 0.55*   Estimated Creatinine Clearance: 106.6 mL/min (by C-G formula based on Cr of 0.55).  Recent Labs  02/16/15 0811  VANCOTROUGH 26*    Assessment: 59 year old male initially on ceftriaxone and azithromycin but broadened to vancomycin and ceftazidime on 6/19 due to worsening infiltrates and concern for empyema.   WBC is within normal limits, Tmax 99.2 last 24 hours.  Vancomcyin trough this AM is 26 on 1g IV q8h. Last dose given at 0109 AM ~ 7hrs since last dose so true trough ~24.5.  SCr is 0.55 - but I believe this is falsely low due to low muscle mass based on his accumulation of vancomycin. UOP is great at 2.4 cc/kg/hr without diuresis.   Goal of Therapy:  Vancomycin trough level 15-20 mcg/ml  Plan:  Decrease Vancomycin to 1250 mg IV every 12 hours Next dose at 2000 PM.  Ceftazidime 1g IV every 8 hours.  Monitor clinical status and culture results. Monitor renal function with knowledge that SCr is likely falsely low.   Link Snuffer, PharmD, BCPS Clinical Pharmacist [phone] 02/16/2015,10:58 AM

## 2015-02-16 NOTE — Progress Notes (Signed)
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 and hypoxic respiratory failure and septic shock with normal lactate H/o chronic noct hypoventilation, non compliant with bipap , narcolepsy on ritalin  STUDIES:  6/20 echo- 55%, pa 37 6/21 Ct head>>neg acute 6/21 ct chest>>>bilat effusions small rt greater left, compressive atx rt, infiltrate rt greater left  SIGNIFICANT EVENTS: 6/18 intubated - designated GF & son as POA, Copious secretions 6/19- pressors remained  SUBJECTIVE: CT done  VITAL SIGNS: Temp:  [97.7 F (36.5 C)-99.2 F (37.3 C)] 97.8 F (36.6 C) (06/22 1129) Pulse Rate:  [60-101] 69 (06/22 1300) Resp:  [19-20] 20 (06/22 1300) BP: (94-135)/(52-97) 98/60 mmHg (06/22 1300) SpO2:  [93 %-100 %] 95 % (06/22 1300) FiO2 (%):  [40 %] 40 % (06/22 1134) Weight:  [90.3 kg (199 lb 1.2 oz)] 90.3 kg (199 lb 1.2 oz) (06/22 0500) HEMODYNAMICS: CVP:  [9 mmHg-53 mmHg] 10 mmHg VENTILATOR SETTINGS: Vent Mode:  [-] PRVC FiO2 (%):  [40 %] 40 % Set Rate:  [20 bmp] 20 bmp Vt Set:  [470 mL] 470 mL PEEP:  [5 cmH20] 5 cmH20 Plateau Pressure:  [18 cmH20-20 cmH20] 19 cmH20 INTAKE / OUTPUT:  Intake/Output Summary (Last 24 hours) at 02/16/15 1353 Last data filed at 02/16/15 1300  Gross per 24 hour  Intake 2884.54 ml  Output   4050 ml  Net -1165.46 ml    PHYSICAL EXAMINATION: General:  Chronically ill  Neuro:  rass am -1, improved since CT head HEENT:  JVD Cardiovascular:  S1 and S2 RRR Lungs:  Anterior clear, reduced rt base Abdomen:  Soft nontender , PEG site clean  Musculoskeletal:  Wasting Skin: no rash  LABS:  CBC  Recent Labs Lab 02/14/15 0442 02/15/15 0511 02/16/15 0550  WBC 8.9 6.8 5.6  HGB 9.4*  9.0* 10.1*  HCT 28.3* 26.9* 30.6*  PLT 143* 145* 207   Coag's  Recent Labs Lab 02/15/15 1455  APTT 39*  INR 1.21   BMET  Recent Labs Lab 02/14/15 1952 02/15/15 0511 02/16/15 0550  NA 135 130* 149*  K 3.2* 3.8 4.0  CL 108 104 114*  CO2 21* 21* 28  BUN 5* 6 10  CREATININE 0.63 0.44* 0.55*  GLUCOSE 154* 174* 172*   Electrolytes  Recent Labs Lab 02/13/15 1935 02/14/15 0442 02/14/15 1952 02/15/15 0511 02/15/15 1029 02/16/15 0550  CALCIUM  --  6.8* 6.7* 6.6*  --  7.9*  MG 2.5* 2.4 2.0  --   --   --   PHOS  --  <1.0* 1.6*  --  2.0*  --    Sepsis Markers  Recent Labs Lab 02/12/15 1025  LATICACIDVEN 1.88   ABG  Recent Labs Lab 02/12/15 1635 02/12/15 2049 02/15/15 1211  PHART 7.178* 7.350 7.339*  PCO2ART 62.3* 36.5 40.8  PO2ART 101.0* 80.0 88.0   Liver Enzymes  Recent Labs Lab 02/15/15 0511  AST 27  ALT 35  ALKPHOS 69  BILITOT 0.9  ALBUMIN 1.7*   Cardiac Enzymes No results for input(s): TROPONINI, PROBNP in the last 168 hours. Glucose  Recent Labs Lab 02/15/15 1636 02/15/15 1952 02/15/15 2346 02/16/15 0419 02/16/15 0750 02/16/15 1128  GLUCAP 163* 162* 142* 144* 157* 147*    Imaging Ct Head Wo  Contrast  02/15/2015   CLINICAL DATA:  Not waking up from anesthesia  EXAM: CT HEAD WITHOUT CONTRAST  TECHNIQUE: Contiguous axial images were obtained from the base of the skull through the vertex without intravenous contrast.  COMPARISON:  02/27/2014  FINDINGS: No acute cortical infarct, hemorrhage, or mass lesion ispresent. Ventricles are of normal size. No significant extra-axial fluid collection is present. The paranasal sinuses are clear. There is partial opacification of the mastoid air cells. The osseous skull is intact. The calvarium appears intact.  IMPRESSION: 1. No acute intracranial abnormalities. 2. Bilateral mastoid air cell opacification.   Electronically Signed   By: Signa Kell M.D.   On: 02/15/2015 16:40   Ct Chest Wo  Contrast  02/15/2015   CLINICAL DATA:  Pneumonia. Patient on ventilation. Small left effusion and dense infiltrate. Small to moderate right effusion unclear if loculated.  EXAM: CT CHEST WITHOUT CONTRAST  TECHNIQUE: Multidetector CT imaging of the chest was performed following the standard protocol without IV contrast.  COMPARISON:  Chest x-ray 02/15/2015  FINDINGS: Left IJ central venous catheter has tip at the cavoatrial junction. Endotracheal tube appears in adequate position. Enteric tube courses into the stomach.  There are small bilateral pleural effusions right greater than left with moderate bibasilar consolidation some of which likely represents compressive atelectasis and some likely due to infection. Some pleural fluid extends into the right major fissure. There is a patchy bilateral airspace process likely multifocal pneumonia. There are a few bilateral calcified granulomas over the lung bases. Mild AP narrowing of the trachea at the carina.  Heart is normal size. Minimal calcified plaque over the left main and left anterior descending coronary arteries. No definite hilar, mediastinal or axillary adenopathy. Remaining mediastinal structures are within normal. Suggestion of minimal left-sided gynecomastia.  Images through the upper abdomen demonstrate no definite focal abnormality. Minimal degenerative change of the spine.  IMPRESSION: Bilateral multifocal airspace process compatible with a multifocal pneumonia. Small bilateral pleural effusions right greater than left with moderate bibasilar consolidation some of which is likely compressive atelectasis and some due to infection. Mild extension of the right effusion into the major fissure.  Tubes and lines as described.  Mild atherosclerotic coronary artery disease.  Probable minimal left sided gynecomastia.   Electronically Signed   By: Elberta Fortis M.D.   On: 02/15/2015 17:06   Dg Chest Port 1 View  02/16/2015   CLINICAL DATA:  Pneumonia.  EXAM:  PORTABLE CHEST - 1 VIEW  COMPARISON:  02/15/2015.  02/14/2015.  CT 02/15/2015 .  FINDINGS: Endotracheal tube, NG tube, left IJ line stable position. Heart size stable. Persistent unchanged bilateral pulmonary infiltrates and/or edema. Persistent pleural effusions, right side greater than left. No pneumothorax. Cholecystectomy.  IMPRESSION: 1. Lines and tubes in stable position. 2. Persistent unchanged prominent bilateral pulmonary infiltrates. Persistent bilateral pleural effusions right side greater than left. No interim improvement.   Electronically Signed   By: Maisie Fus  Register   On: 02/16/2015 07:35     ASSESSMENT / PLAN:  PULMONARY OETT 6/18 >> A:Acute respiratory failure , hypoxic  Chronic hypoventilation , neuromuscular  Bibasal severe CAP Small rt effusions, some compressive atx ARDS Likely P:   CT reviewed, nothing sig to tap, higher risk PTX Neg balance when able Wean attempt wean, ps higher required Control agitation better pcxr in am  Early trach may help with associated MD and sedation issues, delerium  CARDIOVASCULAR CVL LIJ  6/18 >> A: Severe sepsis , normal lactate , residual pressors  remain, Adrenal insuff, re occurrent shock from sedation likely (as correlated well) P:  Stress steroids should remain as remains in shock Levo to map goals  RENAL A:   Hyponatremia (favor dilution), now Hyper? Error? P:  cvp kvo No lasix as of now Repeat NA UA, urine na, osm, serum osm - conflicting  GASTROINTESTINAL A:  Chronic dysphagia s/p PEG T ft goal ppi BM needed, add coalce , mirliax  HEMATOLOGIC A:  DV t prev  Sub q hep coags ok for trach if needed  INFECTIOUS A:  Severe CAP vs aspiration, clinically worse , infiltrates, r/o effusion, had fever P:   BCx2 6/18 Sputum6/18>>>few strep PNA NEG strep urine ag  Abx:   Ceftriaxone 6/18>>>6/20 azithromycin 6/18 >>stop date 6/22 Vanc 6/19 >>6/22 ceftaz 6/20>>>  Nothing to tap as of now Unsure if strep is the  pathogen Dc vanc In am wil consider narrow off ceftaz  ENDOCRINE  A:   AI P:    Monitor CBG Stress roids remain  NEUROLOGIC A:   myotonic dystrophy, vent dyschrony, deep rass resolved P:   RASS goal: 0 Fent gtt, off Versed drip off WUA Add rispirdal  FAMILY  - Updates:  patient designated GF/ son  as [name]  - Inter-disciplinary family meet or Palliative Care meeting due by:  6/25  Ccm time 30 min   Mcarthur Rossetti. Tyson Alias, MD, FACP Pgr: [phone] [facility]

## 2015-02-17 ENCOUNTER — Inpatient Hospital Stay (HOSPITAL_COMMUNITY): Payer: Medicare Other

## 2015-02-17 DIAGNOSIS — Z978 Presence of other specified devices: Secondary | ICD-10-CM | POA: Insufficient documentation

## 2015-02-17 LAB — CBC WITH DIFFERENTIAL/PLATELET
Basophils Absolute: 0 10*3/uL (ref 0.0–0.1)
Basophils Relative: 0 % (ref 0–1)
EOS PCT: 0 % (ref 0–5)
Eosinophils Absolute: 0 10*3/uL (ref 0.0–0.7)
HCT: 28.2 % — ABNORMAL LOW (ref 39.0–52.0)
HEMOGLOBIN: 9 g/dL — AB (ref 13.0–17.0)
LYMPHS ABS: 0.4 10*3/uL — AB (ref 0.7–4.0)
Lymphocytes Relative: 12 % (ref 12–46)
MCH: 30.6 pg (ref 26.0–34.0)
MCHC: 31.9 g/dL (ref 30.0–36.0)
MCV: 95.9 fL (ref 78.0–100.0)
MONOS PCT: 11 % (ref 3–12)
Monocytes Absolute: 0.4 10*3/uL (ref 0.1–1.0)
Neutro Abs: 2.7 10*3/uL (ref 1.7–7.7)
Neutrophils Relative %: 77 % (ref 43–77)
Platelets: 195 10*3/uL (ref 150–400)
RBC: 2.94 MIL/uL — AB (ref 4.22–5.81)
RDW: 15.3 % (ref 11.5–15.5)
WBC: 3.5 10*3/uL — ABNORMAL LOW (ref 4.0–10.5)

## 2015-02-17 LAB — COMPREHENSIVE METABOLIC PANEL
ALT: 29 U/L (ref 17–63)
AST: 21 U/L (ref 15–41)
Albumin: 1.7 g/dL — ABNORMAL LOW (ref 3.5–5.0)
Alkaline Phosphatase: 74 U/L (ref 38–126)
Anion gap: 3 — ABNORMAL LOW (ref 5–15)
BUN: 16 mg/dL (ref 6–20)
CO2: 31 mmol/L (ref 22–32)
Calcium: 8 mg/dL — ABNORMAL LOW (ref 8.9–10.3)
Chloride: 116 mmol/L — ABNORMAL HIGH (ref 101–111)
Creatinine, Ser: 0.53 mg/dL — ABNORMAL LOW (ref 0.61–1.24)
GFR calc Af Amer: >60 mL/min (ref >60)
GFR calc non Af Amer: >60 mL/min (ref >60)
Glucose, Bld: 193 mg/dL — ABNORMAL HIGH (ref 65–99)
Potassium: 3.9 mmol/L (ref 3.5–5.1)
Sodium: 150 mmol/L — ABNORMAL HIGH (ref 135–145)
Total Bilirubin: 0.4 mg/dL (ref 0.3–1.2)
Total Protein: 4.9 g/dL — ABNORMAL LOW (ref 6.5–8.1)

## 2015-02-17 LAB — GLUCOSE, CAPILLARY
GLUCOSE-CAPILLARY: 150 mg/dL — AB (ref 65–99)
GLUCOSE-CAPILLARY: 139 mg/dL — AB (ref 65–99)
GLUCOSE-CAPILLARY: 143 mg/dL — AB (ref 65–99)
GLUCOSE-CAPILLARY: 132 mg/dL — AB (ref 65–99)
Glucose-Capillary: 168 mg/dL — ABNORMAL HIGH (ref 65–99)
Glucose-Capillary: 169 mg/dL — ABNORMAL HIGH (ref 65–99)
Glucose-Capillary: 175 mg/dL — ABNORMAL HIGH (ref 65–99)

## 2015-02-17 LAB — CULTURE, BLOOD (ROUTINE X 2)
CULTURE: NO GROWTH
CULTURE: NO GROWTH

## 2015-02-17 LAB — PROTIME-INR
INR: 1.05 (ref 0.00–1.49)
Prothrombin Time: 13.9 seconds (ref 11.6–15.2)

## 2015-02-17 LAB — APTT: aPTT: 30 seconds (ref 24–37)

## 2015-02-17 LAB — CULTURE, RESPIRATORY W GRAM STAIN

## 2015-02-17 LAB — CULTURE, RESPIRATORY

## 2015-02-17 MED ORDER — VECURONIUM BROMIDE 10 MG IV SOLR: 10.0000 mg | Freq: Once | INTRAVENOUS | Status: DC

## 2015-02-17 MED ORDER — ETOMIDATE 2 MG/ML IV SOLN
40.0000 mg | Freq: Once | INTRAVENOUS | Status: DC
  Filled 2015-02-17: qty 20

## 2015-02-17 MED ORDER — PROPOFOL 500 MG/50ML IV EMUL
5.0000 ug/kg/min | Freq: Once | INTRAVENOUS | Status: DC
  Filled 2015-02-17: qty 50

## 2015-02-17 MED ORDER — VITAL AF 1.2 CAL PO LIQD
1000.0000 mL | ORAL | Status: DC
  Administered 2015-02-17 – 2015-02-20 (×4): 1000 mL
  Filled 2015-02-17 (×8): qty 1000

## 2015-02-17 MED ORDER — FUROSEMIDE 10 MG/ML IJ SOLN
10.0000 mg | Freq: Two times a day (BID) | INTRAMUSCULAR | Status: DC
  Administered 2015-02-17 – 2015-02-18 (×2): 10 mg via INTRAVENOUS
  Filled 2015-02-17 (×2): qty 1
  Filled 2015-02-17 (×2): qty 2

## 2015-02-17 MED ORDER — FREE WATER
200.0000 mL | Freq: Four times a day (QID) | Status: DC
  Administered 2015-02-17 – 2015-02-20 (×13): 200 mL

## 2015-02-17 MED ORDER — FENTANYL CITRATE (PF) 100 MCG/2ML IJ SOLN: 200.0000 ug | Freq: Once | INTRAMUSCULAR | Status: DC

## 2015-02-17 MED ORDER — LACTULOSE 10 GM/15ML PO SOLN
20.0000 g | Freq: Two times a day (BID) | ORAL | Status: DC | PRN
  Filled 2015-02-17 (×2): qty 30

## 2015-02-17 MED ORDER — MIDAZOLAM HCL 2 MG/2ML IJ SOLN
4.0000 mg | Freq: Once | INTRAMUSCULAR | Status: AC
  Administered 2015-02-18: 4 mg via INTRAVENOUS
  Filled 2015-02-17: qty 4

## 2015-02-17 MED ORDER — PROPOFOL 500 MG/50ML IV EMUL
5.0000 ug/kg/min | Freq: Once | INTRAVENOUS | Status: AC
  Administered 2015-02-18: 10 ug/kg/min via INTRAVENOUS
  Filled 2015-02-17: qty 50

## 2015-02-17 MED ORDER — CEFTRIAXONE SODIUM IN DEXTROSE 20 MG/ML IV SOLN
1.0000 g | INTRAVENOUS | Status: AC
  Administered 2015-02-17 – 2015-02-18 (×2): 1 g via INTRAVENOUS
  Filled 2015-02-17 (×2): qty 50

## 2015-02-17 MED ORDER — VECURONIUM BROMIDE 10 MG IV SOLR
10.0000 mg | Freq: Once | INTRAVENOUS | Status: AC
  Administered 2015-02-18: 10 mg via INTRAVENOUS

## 2015-02-17 MED ORDER — MIDAZOLAM HCL 2 MG/2ML IJ SOLN: 4.0000 mg | Freq: Once | INTRAMUSCULAR | Status: DC

## 2015-02-17 MED ORDER — BISACODYL 10 MG RE SUPP
10.0000 mg | Freq: Once | RECTAL | Status: AC
  Administered 2015-02-17: 10 mg via RECTAL
  Filled 2015-02-17: qty 1

## 2015-02-17 MED ORDER — FENTANYL CITRATE (PF) 100 MCG/2ML IJ SOLN
200.0000 ug | Freq: Once | INTRAMUSCULAR | Status: DC
  Filled 2015-02-17: qty 4

## 2015-02-17 MED ORDER — ETOMIDATE 2 MG/ML IV SOLN
40.0000 mg | Freq: Once | INTRAVENOUS | Status: AC
  Administered 2015-02-18: 10 mg via INTRAVENOUS
  Filled 2015-02-17: qty 20

## 2015-02-17 NOTE — Progress Notes (Signed)
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 bibasal pneumonia and hypoxic respiratory failure and septic shock with normal lactate H/o chronic noct hypoventilation, non compliant with bipap , narcolepsy on ritalin  STUDIES:  6/20 echo- 55%, pa 37 6/21 Ct head>>neg acute 6/21 ct chest>>>bilat effusions small rt greater left, compressive atx rt, infiltrate rt greater left  SIGNIFICANT EVENTS: 6/18 intubated - designated GF & son as POA, Copious secretions 6/19- pressors remained  SUBJECTIVE: cycling rass goals  VITAL SIGNS: Temp:  [97.8 F (36.6 C)-99.8 F (37.7 C)] 99.8 F (37.7 C) (06/23 0745) Pulse Rate:  [64-95] 78 (06/23 0900) Resp:  [19-22] 20 (06/23 0900) BP: (94-133)/(52-76) 117/59 mmHg (06/23 0900) SpO2:  [94 %-99 %] 97 % (06/23 0900) FiO2 (%):  [40 %] 40 % (06/23 0815) Weight:  [91.1 kg (200 lb 13.4 oz)] 91.1 kg (200 lb 13.4 oz) (06/23 0500) HEMODYNAMICS: CVP:  [9 mmHg-16 mmHg] 15 mmHg VENTILATOR SETTINGS: Vent Mode:  [-] PRVC FiO2 (%):  [40 %] 40 % Set Rate:  [20 bmp] 20 bmp Vt Set:  [470 mL] 470 mL PEEP:  [5 cmH20] 5 cmH20 Plateau Pressure:  [16 cmH20-20 cmH20] 20 cmH20 INTAKE / OUTPUT:  Intake/Output Summary (Last 24 hours) at 02/17/15 1025 Last data filed at 02/17/15 0800  Gross per 24 hour  Intake 2415.37 ml  Output   1660 ml  Net 755.37 ml    PHYSICAL EXAMINATION: General:  Chronically ill  Neuro:  rass am -2, not following commands HEENT:  JVD Cardiovascular:  S1 and S2 RRR Lungs:  Anterior clear, coarse bases rt worse Abdomen:  Soft nontender , PEG site clean  Musculoskeletal:  Wasting, foot drop Skin: no rash  LABS:  CBC  Recent Labs Lab 02/15/15 0511 02/16/15 0550 02/17/15 0458   WBC 6.8 5.6 3.5*  HGB 9.0* 10.1* 9.0*  HCT 26.9* 30.6* 28.2*  PLT 145* 207 195   Coag's  Recent Labs Lab 02/15/15 1455  APTT 39*  INR 1.21   BMET  Recent Labs Lab 02/16/15 0550 02/16/15 1445 02/17/15 0458  NA 149* 150* 150*  K 4.0 3.5 3.9  CL 114* 117* 116*  CO2 BUN CREATININE 0.55* 0.58* 0.53*  GLUCOSE 172* 232* 193*   Electrolytes  Recent Labs Lab 02/13/15 1935 02/14/15 0442 02/14/15 1952  02/15/15 1029 02/16/15 0550 02/16/15 1445 02/17/15 0458  CALCIUM  --  6.8* 6.7*  < >  --  7.9* 7.8* 8.0*  MG 2.5* 2.4 2.0  --   --   --   --   --   PHOS  --  <1.0* 1.6*  --  2.0*  --   --   --   < > = values in this interval not displayed. Sepsis Markers  Recent Labs Lab 02/12/15 1025  LATICACIDVEN 1.88   ABG  Recent Labs Lab 02/12/15 1635 02/12/15 2049 02/15/15 1211  PHART 7.178* 7.350 7.339*  PCO2ART 62.3* 36.5 40.8  PO2ART 101.0* 80.0 88.0   Liver Enzymes  Recent Labs Lab 02/15/15 0511 02/17/15 0458  AST 27 21  ALT 35 29  ALKPHOS 69 74  BILITOT 0.9 0.4  ALBUMIN 1.7* 1.7*   Cardiac Enzymes No results for input(s): TROPONINI, PROBNP  in the last 168 hours. Glucose  Recent Labs Lab 02/16/15 1600 02/16/15 2037 02/17/15 0004 02/17/15 0017 02/17/15 0428 02/17/15 0743  GLUCAP 196* 180* 168* 175* 169* 150*    Imaging Dg Chest Port 1 View  02/17/2015   CLINICAL DATA:  Hypoxia  EXAM: PORTABLE CHEST - 1 VIEW  COMPARISON:  February 16, 2015  FINDINGS: Endotracheal tube tip is 2.9 cm above the carina. Central catheter tip is at the cavoatrial junction. Nasogastric tube tip and side port are in the stomach. No pneumothorax. There is bibasilar airspace consolidation with bilateral effusions, right larger than left. There is interstitial edema in the mid and lower lung zones as well, stable. There is no new opacity. Heart is upper normal in size with pulmonary vascular within normal limits. No adenopathy.  IMPRESSION: Tube and catheter  positions as described without pneumothorax. Bilateral effusions and airspace consolidation. Interstitial edema is stable. Suspect a degree of congestive heart failure. There may well be superimposed pneumonia in the lower lobes. No new opacity appreciable.   Electronically Signed   By: Bretta Bang III M.D.   On: 02/17/2015 07:30     ASSESSMENT / PLAN:  PULMONARY OETT 6/18 >> A:Acute respiratory failure , hypoxic  Chronic hypoventilation , neuromuscular  Bibasal severe CAP Small rt effusions, some compressive atx ARDS Likely P:   Neg balance when able, with free water Wean cpap 5 ps 8 as able to goal 5 pcxr in am  Early trach, will d/w family today  CARDIOVASCULAR CVL [facility]  6/18 >> A: Severe sepsis , normal lactate , residual pressors remain, Adrenal insuff, re occurrent shock from sedation likely (as correlated well) P:  Stress steroids should remain as levo just shut off Levo off  RENAL A:   Hypernatremia P:  Add free water Lasix start low dose, limited musce mass  GASTROINTESTINAL A:  Chronic dysphagia s/p PEG T ft goal ppi BM needed still, colace , mirliax Add lactulose Add dulx supp Consider use peg Npo 5 am if for trach  HEMATOLOGIC A:  DV t prev  Sub q hep  INFECTIOUS A:  Severe CAP vs aspiration P:   BCx2 6/18 Sputum6/18>>>few strep PNA NEG strep urine ag  Abx:   Ceftriaxone 6/18>>>6/20 azithromycin 6/18 >>stop date 6/22 Vanc 6/19 >>6/22 ceftaz 6/20>>>6/23 Ceftriaxone 6/23>>>stop date 8 days  Narrow to ceftriaxone  ENDOCRINE  A:   AI P:    Monitor CBG Stress roids remain until off pressors x 24 hr  NEUROLOGIC A:   myotonic dystrophy, vent dyschrony, deep rass resolved P:   RASS goal: 0 Fent gtt, with WUA Versed attempt dc WUA Risperdal may need increase  FAMILY  - Updates:  patient designated GF/ son  as [name]  - Inter-disciplinary family meet or Palliative Care meeting due by:  6/25  Ccm time 30 min   Mcarthur Rossetti.  Tyson Alias, MD, FACP Pgr: [phone] [facility]

## 2015-02-17 NOTE — Progress Notes (Signed)
25cc of versed gtt wasted in sink witnessed by Barbaraann Faster RN.

## 2015-02-17 NOTE — Progress Notes (Signed)
Notified Elink RN Gretchin in regards to BMET Na being 150 and trending upwards. Gretchin RN said that will pass along to MD Ramaswamy.

## 2015-02-17 NOTE — Progress Notes (Signed)
Nutrition Follow-up   INTERVENTION:   Continue TF via PEG with Vital AF 1.2 decrease goal rate to 70 ml/h (1680 ml per day) to provide 2016 kcals, 126 gm protein, 1362 ml free water daily.  NUTRITION DIAGNOSIS:  Inadequate oral intake related to inability to eat, dysphagia as evidenced by NPO status.  Ongoing  GOAL:  Patient will meet greater than or equal to 90% of their needs  Met  MONITOR:  TF tolerance, Labs, Weight trends, Vent status  REASON FOR ASSESSMENT:  Consult Enteral/tube feeding initiation and management  ASSESSMENT:  Patient with myotonic dystrophy, presented on 6/18 with pneumonia, hypoxic respiratory failure, and septic shock. Hx of chronic nocturnal hypoventilation, non compliant with BiPAP, and narcolepsy. PEG in place for chronic dysphagia.  Patient is currently receiving Vital AF 1.2 via PEG at 75 ml/h (1800 ml/day) to provide 2160 kcals, 135 gm protein, 1460 ml free water daily. Tolerating well with no residuals. May require early trach, MD to discuss with family.  Labs reviewed: sodium is elevated. Free water flushes added, 200 ml every 6 hours.  Patient is currently intubated on ventilator support MV: 9.1 L/min Temp (24hrs), Avg:98.9 F (37.2 C), Min:97.8 F (36.6 C), Max:99.8 F (37.7 C)  Propofol: none   Height:  Ht Readings from Last 1 Encounters:  02/12/15 5' 7"  (1.702 m)    Weight:  Wt Readings from Last 1 Encounters:  02/17/15 200 lb 13.4 oz (91.1 kg)   02/14/15 197 lb 15.6 oz (89.8 kg)  06/18/16186 lbb 1.1 oz (84.4 kg)      Ideal Body Weight:  67.3 kg   BMI:  29.1 (using admit weight)  Estimated Nutritional Needs:  Kcal:  1923  Protein:  110-130 gm  Fluid:  2 L  Skin:  Reviewed, no issues  Diet Order:  Diet NPO time specified  EDUCATION NEEDS:  No education needs identified at this time   Intake/Output Summary (Last 24 hours) at 02/17/15 1122 Last data filed at 02/17/15 1000  Gross per 24  hour  Intake 2312.67 ml  Output   1805 ml  Net 507.67 ml    Last BM:  Unknown (colace and miralax ordered)   Molli Barrows, RD, LDN, [place] Pager [phone] After Hours Pager [phone]

## 2015-02-17 NOTE — Progress Notes (Signed)
Called to room by RN, pt seems agitated, HR & BP inc, placed pt back on full support at this time, will continue to monitor

## 2015-02-18 ENCOUNTER — Inpatient Hospital Stay (HOSPITAL_COMMUNITY): Payer: Medicare Other

## 2015-02-18 LAB — CBC
HEMATOCRIT: 30.7 % — AB (ref 39.0–52.0)
Hemoglobin: 9.4 g/dL — ABNORMAL LOW (ref 13.0–17.0)
MCH: 29.7 pg (ref 26.0–34.0)
MCHC: 30.6 g/dL (ref 30.0–36.0)
MCV: 97.2 fL (ref 78.0–100.0)
Platelets: 224 10*3/uL (ref 150–400)
RBC: 3.16 MIL/uL — AB (ref 4.22–5.81)
RDW: 15.6 % — ABNORMAL HIGH (ref 11.5–15.5)
WBC: 5.6 10*3/uL (ref 4.0–10.5)

## 2015-02-18 LAB — GLUCOSE, CAPILLARY
GLUCOSE-CAPILLARY: 116 mg/dL — AB (ref 65–99)
GLUCOSE-CAPILLARY: 90 mg/dL (ref 65–99)
Glucose-Capillary: 103 mg/dL — ABNORMAL HIGH (ref 65–99)
Glucose-Capillary: 140 mg/dL — ABNORMAL HIGH (ref 65–99)
Glucose-Capillary: 143 mg/dL — ABNORMAL HIGH (ref 65–99)
Glucose-Capillary: 148 mg/dL — ABNORMAL HIGH (ref 65–99)

## 2015-02-18 LAB — BASIC METABOLIC PANEL
Anion gap: 5 (ref 5–15)
BUN: 18 mg/dL (ref 6–20)
CO2: 34 mmol/L — ABNORMAL HIGH (ref 22–32)
Calcium: 7.8 mg/dL — ABNORMAL LOW (ref 8.9–10.3)
Chloride: 110 mmol/L (ref 101–111)
Creatinine, Ser: 0.52 mg/dL — ABNORMAL LOW (ref 0.61–1.24)
GFR calc Af Amer: >60 mL/min (ref >60)
GFR calc non Af Amer: >60 mL/min (ref >60)
Glucose, Bld: 142 mg/dL — ABNORMAL HIGH (ref 65–99)
Potassium: 3.9 mmol/L (ref 3.5–5.1)
Sodium: 149 mmol/L — ABNORMAL HIGH (ref 135–145)

## 2015-02-18 MED ORDER — DEXTROSE 5 % IV SOLN
INTRAVENOUS | Status: DC
  Administered 2015-02-18 – 2015-02-20 (×3): via INTRAVENOUS

## 2015-02-18 MED ORDER — HYDROCORTISONE NA SUCCINATE PF 100 MG IJ SOLR
50.0000 mg | Freq: Two times a day (BID) | INTRAMUSCULAR | Status: DC
  Administered 2015-02-18 (×2): 50 mg via INTRAVENOUS
  Filled 2015-02-18 (×2): qty 1

## 2015-02-18 MED ORDER — FUROSEMIDE 10 MG/ML IJ SOLN
20.0000 mg | Freq: Three times a day (TID) | INTRAMUSCULAR | Status: DC
  Administered 2015-02-18 – 2015-02-19 (×4): 20 mg via INTRAVENOUS
  Filled 2015-02-18 (×4): qty 2

## 2015-02-18 NOTE — Progress Notes (Signed)
Updated son Forde Radon on the trach procedure. Gave the phone to Avie Arenas to discuss placement

## 2015-02-18 NOTE — Procedures (Signed)
Bedside Tracheostomy Insertion Procedure Note   Patient Details:   Name: Carl Galvan DOB: 04/17/1956 MRN: [id number]  Procedure: Tracheostomy  Pre Procedure Assessment: ET Tube Size:7.5 ET Tube secured at lip (cm):22 Bite block in place: No Breath Sounds: Rhonch  Post Procedure Assessment: BP 149/79 mmHg  Pulse 108  Temp(Src) 99.4 F (37.4 C) (Oral)  Resp 23  Ht 5\' 7"  (1.702 m)  Wt 200 lb 13.4 oz (91.1 kg)  BMI 31.45 kg/m2  SpO2 93% O2 sats: stable throughout Complications: No apparent complications Patient did tolerate procedure well Tracheostomy Brand:Shiley Tracheostomy Style:Cuffed Tracheostomy Size: 6 Tracheostomy Secured ELY:[id number] Tracheostomy Placement Confirmation:Trach cuff visualized and in place and Chest X ray ordered for placement    Cherylin Mylar 02/18/2015, 10:12 AM

## 2015-02-18 NOTE — Procedures (Signed)
Bronchoscopy  for Percutaneous  Tracheostomy  Name: FOTIS FRANCHINA MRN: [id number] DOB: 07/14/1956 Procedure: Bronchoscopy for Percutaneous Tracheostomy Indications: Diagnostic evaluation of the airways and tracheostomy In conjunction with: Dr. Tyson Alias   Procedure Details Consent: Risks of procedure as well as the alternatives and risks of each were explained to the (patient/caregiver).  Consent for procedure obtained. Time Out: Verified patient identification, verified procedure, site/side was marked, verified correct patient position, special equipment/implants available, medications/allergies/relevent history reviewed, required imaging and test results available.  Performed  In preparation for procedure, patient was given 100% FiO2 and bronchoscope lubricated. Sedation: Benzodiazepines, Muscle relaxants and Etomidate  Airway entered and the following bronchi were examined: RML.   Procedures performed: Endotracheal Tube retracted in 2 cm increments. Cannulation of airway observed. Dilation observed. Placement of trachel tube  observed . No overt complications. Bronchoscope removed.    Evaluation Hemodynamic Status: BP stable throughout; O2 sats: stable throughout Patient's Current Condition: stable Specimens:  None Complications: No apparent complications Patient did tolerate procedure well.   Brett Canales Minor ACNP [facility] Pager [phone] till 3 pm If no answer page [phone]  I was present and supervised the entire procedure.  Alyson Reedy, M.D. [facility]. Pager: [phone]. After hours pager: [phone].  02/18/2015, 9:54 AM

## 2015-02-18 NOTE — Procedures (Signed)
Name:  Carl Galvan MRN:  [id number] DOB:  [birth year]  OPERATIVE NOTE  Procedure:  Percutaneous tracheostomy.  Indications:  Ventilator-dependent respiratory failure.  Consent:  Procedure, alternatives, risks and benefits discussed with medical POA.  Questions answered.  Consent obtained.  Anesthesia:  Vec, etom, prop, versed, fent  Procedure summary:  Appropriate equipment was assembled.  The patient was identified as Carl Galvan and safety timeout was performed. The patient was placed in supine position with a towel roll behind shoulder blades and neck extended.  Sterile technique was used. The patient's neck and upper chest were prepped using chlorhexidine / alcohol scrub and the field was draped in usual sterile fashion with full body drape. After the adequate sedation / anesthesia was achieved, attention was directed at the midline trachea, where the cricothyroid membrane was palpated. Approximately two fingerbreadths above the sternal notch, a verticle incision was created with a scalpel after local infiltration with 0.2% Lidocaine. Then, using Seldinger technique and a percutaneous tracheostomy set, the trachea was entered with a 14 gauge needle with an overlying sheath. This was all confirmed under direct visualization of a fiberoptic flexible bronchoscope. Entrance into the trachea was identified through the third tracheal ring interspace. Following this, a guidewire was inserted. The needle was removed, leaving the sheath and the guidewire intact. Next, the sheath was removed and a small dilator was inserted. The tracheal rings were then dilated. A #6 Shiley was then opened. The balloon was checked. It was placed over a tracheal dilator, which was then advanced over the guidewire and through the previously dilated tract. The Shiley tracheostomy tube was noted to pass in the trachea with little resistance. The guidewire and dilator tubes were removed from the trachea. An inner cannula was  placed through the tracheostomy tube. The tracheostomy was then secured at the anterior neck with 4 monofilament sutures. The oral endotracheal tube was removed and the ventilator was attached to the newly placed tracheostomy tube. Adequate tidal volumes were noted. The cuff was inflated and no evidence of air leak was noted. No evidence of bleeding was noted. At this point, the procedure was concluded. Post-procedure chest x-ray was ordered.  Complications:  No immediate complications were noted.  Hemodynamic parameters and oxygenation remained stable throughout the procedure.  Estimated blood loss:  Less then 1 mL.  Nelda Bucks., MD Pulmonary and Critical Care Medicine [facility] Pager: [phone]  02/18/2015, 9:55 AM

## 2015-02-18 NOTE — Progress Notes (Signed)
200 mls of Fentanyl gtt wasted in sink witnessed by Gerarda Gunther RN and Manufacturing engineer.

## 2015-02-18 NOTE — Progress Notes (Signed)
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 and hypoxic respiratory failure and septic shock with normal lactate H/o chronic noct hypoventilation, non compliant with bipap , narcolepsy on ritalin  STUDIES:  6/20 echo- 55%, pa 37 6/21 Ct head>>neg acute 6/21 ct chest>>>bilat effusions small rt greater left, compressive atx rt, infiltrate rt greater left  SIGNIFICANT EVENTS: 6/18 intubated - designated GF & son as POA, Copious secretions 6/19- pressors remained  SUBJECTIVE: did some weaning, secretions still an issue  VITAL SIGNS: Temp:  [98.8 F (37.1 C)-99.5 F (37.5 C)] 99.4 F (37.4 C) (06/24 0742) Pulse Rate:  [59-124] 108 (06/24 0700) Resp:  [9-23] 23 (06/24 0700) BP: (90-157)/(48-86) 149/79 mmHg (06/24 0700) SpO2:  [90 %-99 %] 99 % (06/24 0700) FiO2 (%):  [40 %] 40 % (06/24 0700) Weight:  [91.1 kg (200 lb 13.4 oz)] 91.1 kg (200 lb 13.4 oz) (06/24 0454) HEMODYNAMICS:   VENTILATOR SETTINGS: Vent Mode:  [-] PRVC FiO2 (%):  [40 %] 40 % Set Rate:  [20 bmp] 20 bmp Vt Set:  [470 mL] 470 mL PEEP:  [5 cmH20] 5 cmH20 Pressure Support:  [5 cmH20] 5 cmH20 Plateau Pressure:  [19 cmH20-20 cmH20] 20 cmH20 INTAKE / OUTPUT:  Intake/Output Summary (Last 24 hours) at 02/18/15 0815 Last data filed at 02/18/15 0700  Gross per 24 hour  Intake 2608.15 ml  Output   1970 ml  Net 638.15 ml    PHYSICAL EXAMINATION: General:  Chronically ill  Neuro:  rass am 1,  following commands with agitation HEENT:  JVD Cardiovascular:  S1 and S2 RRR Lungs:  Anterior entry clear, ronchi bases insp Abdomen:  Soft nontender , PEG site clean  Musculoskeletal:  Wasting, foot drop Skin: no rash  LABS:  CBC  Recent Labs Lab  02/16/15 0550 02/17/15 0458 02/18/15 0549  WBC 5.6 3.5* 5.6  HGB 10.1* 9.0* 9.4*  HCT 30.6* 28.2* 30.7*  PLT 207 195 224   Coag's  Recent Labs Lab 02/15/15 1455 02/17/15 1401  APTT 39* 30  INR 1.21 1.05   BMET  Recent Labs Lab 02/16/15 1445 02/17/15 0458 02/18/15 0549  NA 150* 150* 149*  K 3.5 3.9 3.9  CL 117* 116* 110  CO2 29 31 34*  BUN CREATININE 0.58* 0.53* 0.52*  GLUCOSE 232* 193* 142*   Electrolytes  Recent Labs Lab 02/13/15 1935 02/14/15 0442 02/14/15 1952  02/15/15 1029  02/16/15 1445 02/17/15 0458 02/18/15 0549  CALCIUM  --  6.8* 6.7*  < >  --   < > 7.8* 8.0* 7.8*  MG 2.5* 2.4 2.0  --   --   --   --   --   --   PHOS  --  <1.0* 1.6*  --  2.0*  --   --   --   --   < > = values in this interval not displayed. Sepsis Markers  Recent Labs Lab 02/12/15 1025  LATICACIDVEN 1.88   ABG  Recent Labs Lab 02/12/15 1635 02/12/15 2049 02/15/15 1211  PHART 7.178* 7.350 7.339*  PCO2ART 62.3* 36.5 40.8  PO2ART 101.0* 80.0 88.0   Liver Enzymes  Recent Labs Lab 02/15/15 0511 02/17/15 0458  AST 27 21  ALT 35 29  ALKPHOS 69 74  BILITOT 0.9 0.4  ALBUMIN 1.7* 1.7*   Cardiac Enzymes No results for input(s): TROPONINI, PROBNP in the last 168 hours. Glucose  Recent Labs Lab 02/17/15 0743 02/17/15 1152 02/17/15 1620 02/17/15 2020 02/17/15 2336 02/18/15 0329  GLUCAP 150* 139* 143* 132* 143* 103*    Imaging Dg Chest Port 1 View  02/18/2015   CLINICAL DATA:  Intubation.  EXAM: PORTABLE CHEST - 1 VIEW  COMPARISON:  02/17/2015 .  FINDINGS: Endotracheal tube, left IJ line, NG tube in stable position. Heart size stable. Persistent bilateral pulmonary infiltrates with bilateral pleural effusions, right side greater than left. No significant interim change. Findings most likely related to congestive heart failure. Associated pneumonia cannot be excluded. No pneumothorax.  IMPRESSION: 1. Lines and tubes in stable position. 2. Persistent  bilateral pulmonary alveolar infiltrates with bilateral pleural effusions, right side greater than left. Findings most consistent with congestive heart failure and pulmonary edema. Associated pneumonia cannot be excluded. No interim change from prior exam .   Electronically Signed   By: Maisie Fus  Register   On: 02/18/2015 07:48     ASSESSMENT / PLAN:  PULMONARY OETT 6/18 >> A:Acute respiratory failure , hypoxic  Chronic hypoventilation , neuromuscular  Bibasal severe CAP Small rt effusions, some compressive atx ARDS Likely P:   Neg balance  Goals  Weaning to trach collar as goal, post trach planned this am May need cpap 5 ps 5 prior to TC attempt  May need follow up US chest , pcxr with perstitent infiltrates  CARDIOVASCULAR CVL [facility]  6/18 >> A: Severe sepsis , normal lactate , residual pressors remain, Adrenal insuff, re occurrent shock from sedation likely (as correlated well) P:  Stress steroids reduce tele  RENAL A:   Hypernatremia P:  Add free water Increase lasix, was positive Add d5w  GASTROINTESTINAL A:  Chronic dysphagia s/p PEG T ft goal BM noted success PLAN: ppi colace , mirliax lactulose Npo for trach  HEMATOLOGIC A:  DV t prev  Sub q hep  INFECTIOUS A:  Severe CAP vs aspiration P:   BCx2 6/18 Sputum6/18>>>few strep PNA NEG strep urine ag  Abx:   Ceftriaxone 6/18>>>6/20 azithromycin 6/18 >>stop date 6/22 Vanc 6/19 >>6/22 ceftaz 6/20>>>6/23 Ceftriaxone 6/23>>>stop date 8 days  No changes, allow abx to dc on stop date saturday  ENDOCRINE  A:   AI P:    Monitor CBG Stress roids remain until off pressors x 24 hr  NEUROLOGIC A:   myotonic dystrophy, vent dyschrony, deep rass resolved P:   RASS goal: 0 Fent gtt, with WUA Risperdal holding since 6/23  FAMILY  - Updates:  patient designated GF/ son  as [name] and sister I updatd in full 6/23  - Inter-disciplinary family meet or Palliative Care meeting due by:  6/23 done feinstein  Ccm  time 30 min   Mcarthur Rossetti. Tyson Alias, MD, FACP Pgr: [phone] [facility]

## 2015-02-18 NOTE — Care Management Note (Signed)
Case Management Note  Patient Details  Name: STEFFEN DOHN MRN: [id number] Date of Birth: 10-Feb-1956  Subjective/Objective:   Patient trached today.  [facility] eligible.  Referrals made locally to [facility] and [facility], however family is from [place] area and per son prefer [facility] in [place].  However found out that due to having two  ltach's being locally, insurance will probably not pay for transport and family will need to pick up that cost. Called son back and left this as a message and to call back.  Will continue plan at this time for local [facility] or [facility].    4pm.  I have called and left messages for son x3 with no call back.  Called sister, Marcelino Duster.  Explained that son, Forde Radon is using the patients phone and may not have access to voice mail.  Explained the conversations above to sister.  She stated they could not afford transport to [place], understanding that their options would be the facilities in [place].  Explained that both facilities here are offering a bed for Sunday.  They would need to choose either, [facility] or [facility].  Stated they would  be back at hospital on Saturday and would let the nurse know.  Once the nurse finds out what facility they desire, the nurse will need to let the CM on call on amion know so the CM can notify the laison of patients decision.  The plan is for transfer to one of these [facility] on Sunday.               Action/Plan:   Expected Discharge Date:                  Expected Discharge Plan:  Long Term Acute Care (LTAC)  In-House Referral:     Discharge planning Services     Post Acute Care Choice:    Choice offered to:     DME Arranged:    DME Agency:     HH Arranged:    HH Agency:     Status of Service:  In process, will continue to follow  Medicare Important Message Given:  Yes Date Medicare IM Given:  02/18/15 Medicare IM give by:  Avie Arenas, RNBSN Date Additional Medicare IM Given:    Additional Medicare Important Message  give by:     If discussed at Long Length of Stay Meetings, dates discussed:    Additional Comments:  Vangie Bicker, RN 02/18/2015, 11:02 AM

## 2015-02-19 ENCOUNTER — Inpatient Hospital Stay (HOSPITAL_COMMUNITY): Payer: Medicare Other

## 2015-02-19 LAB — BASIC METABOLIC PANEL
Anion gap: 5 (ref 5–15)
BUN: 22 mg/dL — ABNORMAL HIGH (ref 6–20)
CALCIUM: 7.6 mg/dL — AB (ref 8.9–10.3)
CHLORIDE: 104 mmol/L (ref 101–111)
CO2: 37 mmol/L — AB (ref 22–32)
Creatinine, Ser: 0.57 mg/dL — ABNORMAL LOW (ref 0.61–1.24)
GFR calc Af Amer: >60 mL/min (ref >60)
GFR calc non Af Amer: >60 mL/min (ref >60)
GLUCOSE: 130 mg/dL — AB (ref 65–99)
Potassium: 3.5 mmol/L (ref 3.5–5.1)
Sodium: 146 mmol/L — ABNORMAL HIGH (ref 135–145)

## 2015-02-19 LAB — CBC WITH DIFFERENTIAL/PLATELET
BASOS ABS: 0 10*3/uL (ref 0.0–0.1)
Basophils Relative: 0 % (ref 0–1)
EOS PCT: 0 % (ref 0–5)
Eosinophils Absolute: 0 10*3/uL (ref 0.0–0.7)
HCT: 31.2 % — ABNORMAL LOW (ref 39.0–52.0)
HEMOGLOBIN: 9.7 g/dL — AB (ref 13.0–17.0)
Lymphocytes Relative: 12 % (ref 12–46)
Lymphs Abs: 0.8 10*3/uL (ref 0.7–4.0)
MCH: 30.1 pg (ref 26.0–34.0)
MCHC: 31.1 g/dL (ref 30.0–36.0)
MCV: 96.9 fL (ref 78.0–100.0)
MONOS PCT: 7 % (ref 3–12)
Monocytes Absolute: 0.5 10*3/uL (ref 0.1–1.0)
NEUTROS ABS: 5.3 10*3/uL (ref 1.7–7.7)
Neutrophils Relative %: 81 % — ABNORMAL HIGH (ref 43–77)
Platelets: 245 10*3/uL (ref 150–400)
RBC: 3.22 MIL/uL — AB (ref 4.22–5.81)
RDW: 15.2 % (ref 11.5–15.5)
WBC: 6.6 10*3/uL (ref 4.0–10.5)

## 2015-02-19 LAB — GLUCOSE, CAPILLARY
Glucose-Capillary: 102 mg/dL — ABNORMAL HIGH (ref 65–99)
Glucose-Capillary: 118 mg/dL — ABNORMAL HIGH (ref 65–99)
Glucose-Capillary: 123 mg/dL — ABNORMAL HIGH (ref 65–99)
Glucose-Capillary: 128 mg/dL — ABNORMAL HIGH (ref 65–99)
Glucose-Capillary: 133 mg/dL — ABNORMAL HIGH (ref 65–99)
Glucose-Capillary: 87 mg/dL (ref 65–99)

## 2015-02-19 LAB — MAGNESIUM: Magnesium: 2.2 mg/dL (ref 1.7–2.4)

## 2015-02-19 LAB — PHOSPHORUS: Phosphorus: 2.4 mg/dL — ABNORMAL LOW (ref 2.5–4.6)

## 2015-02-19 MED ORDER — MIDAZOLAM HCL 2 MG/2ML IJ SOLN
2.0000 mg | INTRAMUSCULAR | Status: DC | PRN
  Administered 2015-02-19 – 2015-02-20 (×4): 2 mg via INTRAVENOUS
  Filled 2015-02-19 (×4): qty 2

## 2015-02-19 MED ORDER — HYDROCORTISONE NA SUCCINATE PF 100 MG IJ SOLR
25.0000 mg | Freq: Two times a day (BID) | INTRAMUSCULAR | Status: DC
  Administered 2015-02-19 – 2015-02-20 (×3): 25 mg via INTRAVENOUS
  Filled 2015-02-19 (×2): qty 0.5

## 2015-02-19 MED ORDER — IPRATROPIUM-ALBUTEROL 0.5-2.5 (3) MG/3ML IN SOLN
3.0000 mL | Freq: Four times a day (QID) | RESPIRATORY_TRACT | Status: DC
  Administered 2015-02-20 (×3): 3 mL via RESPIRATORY_TRACT
  Filled 2015-02-19 (×3): qty 3

## 2015-02-19 MED ORDER — POTASSIUM CHLORIDE 20 MEQ/15ML (10%) PO SOLN
20.0000 meq | ORAL | Status: AC
  Administered 2015-02-19 (×2): 20 meq
  Filled 2015-02-19: qty 15

## 2015-02-19 MED ORDER — RISPERIDONE 1 MG/ML PO SOLN
1.0000 mg | Freq: Two times a day (BID) | ORAL | Status: DC
  Administered 2015-02-19 – 2015-02-20 (×3): 1 mg via ORAL
  Filled 2015-02-19 (×4): qty 1

## 2015-02-19 MED ORDER — FUROSEMIDE 10 MG/ML IJ SOLN
40.0000 mg | Freq: Four times a day (QID) | INTRAMUSCULAR | Status: DC
  Administered 2015-02-19 – 2015-02-20 (×5): 40 mg via INTRAVENOUS
  Filled 2015-02-19 (×6): qty 4

## 2015-02-19 MED ORDER — LORAZEPAM 2 MG/ML IJ SOLN
INTRAMUSCULAR | Status: AC
  Filled 2015-02-19: qty 1

## 2015-02-19 MED ORDER — PANTOPRAZOLE SODIUM 40 MG PO PACK
40.0000 mg | PACK | Freq: Every day | ORAL | Status: DC
  Administered 2015-02-19 – 2015-02-20 (×2): 40 mg
  Filled 2015-02-19 (×2): qty 20

## 2015-02-19 MED ORDER — LORAZEPAM 2 MG/ML IJ SOLN
1.0000 mg | INTRAMUSCULAR | Status: DC | PRN
  Administered 2015-02-19: 1 mg via INTRAVENOUS
  Filled 2015-02-19: qty 1

## 2015-02-19 NOTE — Progress Notes (Signed)
Patient hypotensive. Fentanyl gtt stopped until bp increases.

## 2015-02-19 NOTE — Care Management Note (Signed)
Case Management Note  Patient Details  Name: [name] MRN: [id number] Date of Birth: Feb 08, 1956  Subjective/Objective:                   Respiratory distress Action/Plan: Discharge planning  Expected Discharge Date:  unknown               Expected Discharge Plan:  Long Term Acute Care (LTAC)  In-House Referral:     Discharge planning Services     Post Acute Care Choice:    Choice offered to:     DME Arranged:    DME Agency:     HH Arranged:    HH Agency:     Status of Service:  In process, will continue to follow  Medicare Important Message Given:  Yes Date Medicare IM Given:  02/18/15 Medicare IM give by:  Luz Lex, RNBSN Date Additional Medicare IM Given:    Additional Medicare Important Message give by:     If discussed at [place] of Stay Meetings, dates discussed:    Additional Comments: CM met with pt, sister of pt and son of pt in room.  Family has decided on [facility] for long term acute care of pt.  CM notified [facility] rep, [facility] Glad family chooses [facility] and Santiago Glad states [facility] can admit Sunday or Monday.  CM notified RN.  CM will monitor for any transition needs.   Dellie Catholic, RN 02/19/2015, 2:32 PM

## 2015-02-19 NOTE — Progress Notes (Signed)
[facility] ADULT ICU REPLACEMENT PROTOCOL FOR AM LAB REPLACEMENT ONLY  The patient does apply for the [facility] Adult ICU Electrolyte Replacment Protocol based on the criteria listed below:   1. Is GFR >/= 40 ml/min? Yes.    Patient's GFR today is >60 2. Is urine output >/= 0.5 ml/kg/hr for the last 6 hours? Yes.   Patient's UOP is 1.14 ml/kg/hr 3. Is BUN < 60 mg/dL? Yes.    Patient's BUN today is 22 4. Abnormal electrolyte K 3.5 5. Ordered repletion with: per protocol 6. If a panic level lab has been reported, has the CCM MD in charge been notified? Yes.  .   Physician:  Keene Breath 02/19/2015 6:05 AM

## 2015-02-19 NOTE — Progress Notes (Signed)
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 pneumonia and hypoxic respiratory failure and septic shock with normal lactate H/o chronic noct hypoventilation, non compliant with bipap , narcolepsy on ritalin  STUDIES:  6/20 echo- 55%, pa 37 6/21 Ct head>>neg acute 6/21 ct chest>>>bilat effusions small rt greater left, compressive atx rt, infiltrate rt greater left  SIGNIFICANT EVENTS: 6/18 intubated - designated GF & son as POA, Copious secretions 6/19- pressors remained 6/24 trach 6/25- remains on sedation drips  SUBJECTIVE: trach clean  VITAL SIGNS: Temp:  [98.9 F (37.2 C)-99.5 F (37.5 C)] 99.4 F (37.4 C) (06/25 0821) Pulse Rate:  [57-127] 103 (06/25 0800) Resp:  [13-26] 18 (06/25 0800) BP: (79-176)/(36-115) 129/63 mmHg (06/25 0800) SpO2:  [91 %-100 %] 96 % (06/25 0800) FiO2 (%):  [40 %] 40 % (06/25 0900) HEMODYNAMICS:   VENTILATOR SETTINGS: Vent Mode:  [-] PRVC FiO2 (%):  [40 %] 40 % Set Rate:  [20 bmp] 20 bmp Vt Set:  [470 mL] 470 mL PEEP:  [5 cmH20] 5 cmH20 Plateau Pressure:  [16 cmH20-22 cmH20] 16 cmH20 INTAKE / OUTPUT:  Intake/Output Summary (Last 24 hours) at 02/19/15 0941 Last data filed at 02/19/15 0800  Gross per 24 hour  Intake   2826 ml  Output   2100 ml  Net    726 ml    PHYSICAL EXAMINATION: General:  Chronically ill  Neuro:  rass 2,  following commands with agitation HEENT:  JVD, trach clean Cardiovascular:  S1 and S2 RRR Lungs:  Reduced, clear Abdomen:  Soft nontender , PEG site clean  Musculoskeletal:  Wasting, foot drop Skin: no rash  LABS:  CBC  Recent Labs Lab 02/17/15 0458 02/18/15 0549 02/19/15 0350  WBC 3.5* 5.6 6.6  HGB 9.0* 9.4* 9.7*  HCT 28.2* 30.7* 31.2*  PLT  195 224 245   Coag's  Recent Labs Lab 02/15/15 1455 02/17/15 1401  APTT 39* 30  INR 1.21 1.05   BMET  Recent Labs Lab 02/17/15 0458 02/18/15 0549 02/19/15 0350  NA 150* 149* 146*  K 3.9 3.9 3.5  CL 116* 110 104  CO2 31 34* 37*  BUN 16 18 22*  CREATININE 0.53* 0.52* 0.57*  GLUCOSE 193* 142* 130*   Electrolytes  Recent Labs Lab 02/14/15 0442 02/14/15 1952  02/15/15 1029  02/17/15 0458 02/18/15 0549 02/19/15 0350  CALCIUM 6.8* 6.7*  < >  --   < > 8.0* 7.8* 7.6*  MG 2.4 2.0  --   --   --   --   --  2.2  PHOS <1.0* 1.6*  --  2.0*  --   --   --  2.4*  < > = values in this interval not displayed. Sepsis Markers  Recent Labs Lab 02/12/15 1025  LATICACIDVEN 1.88   ABG  Recent Labs Lab 02/12/15 1635 02/12/15 2049 02/15/15 1211  PHART 7.178* 7.350 7.339*  PCO2ART 62.3* 36.5 40.8  PO2ART 101.0* 80.0 88.0   Liver Enzymes  Recent Labs Lab 02/15/15 0511 02/17/15 0458  AST 27 21  ALT 35 29  ALKPHOS 69 74  BILITOT 0.9 0.4  ALBUMIN 1.7* 1.7*   Cardiac Enzymes No results for input(s): TROPONINI, PROBNP in the last 168 hours. Glucose  Recent Labs Lab  02/18/15 1146 02/18/15 1549 02/18/15 2017 02/18/15 2345 02/19/15 0324 02/19/15 0819  GLUCAP 140* 148* 90 133* 118* 102*    Imaging Dg Chest Port 1 View  02/19/2015   CLINICAL DATA:  Pneumonia.  EXAM: PORTABLE CHEST - 1 VIEW  COMPARISON:  02/18/2015  FINDINGS: Tracheostomy tube in adequate position and unchanged. Left IJ central venous catheter has tip over the right atrium just below the cavoatrial junction.  Lungs are moderately hypoinflated with opacification over the mid to lower lungs with slight worsening in the left midlung. Findings are likely due to small bilateral pleural effusions with associated atelectasis. Cannot exclude infection. Remainder of the exam is unchanged.  IMPRESSION: Moderate hypoinflation with opacification over the mid to lower lungs bilaterally with slight worsening over the  left midlung. Findings are likely due to the effusions with atelectasis, although cannot exclude infection.  Tubes and lines as described. Note the left IJ central venous catheter has tip over the right atrium.   Electronically Signed   By: Elberta Fortis M.D.   On: 02/19/2015 09:03   Dg Chest Port 1 View  02/18/2015   CLINICAL DATA:  Check tracheostomy tube placement  EXAM: PORTABLE CHEST - 1 VIEW  COMPARISON:  None.  FINDINGS: Cardiac shadow is stable. A left jugular central line is again seen and stable. The nasogastric catheter has been removed. Endotracheal tube is also been removed and replaced with a tracheostomy tube in satisfactory position. Bilateral pleural effusions right greater than left are noted and stable.  IMPRESSION: Status post tracheostomy placement in satisfactory position. The remainder of the exam is unchanged with the exception of nasogastric catheter removal.   Electronically Signed   By: Alcide Clever M.D.   On: 02/18/2015 10:29     ASSESSMENT / PLAN:  PULMONARY OETT 6/18 >> A:Acute respiratory failure , hypoxic  Chronic hypoventilation , neuromuscular  Bibasal severe CAP Small rt effusions, some compressive atx ARDS Likely P:   Weaning to trach collar, last 1 hr, failed, to cpap ps 10 Unable to PMV Neg balance, increase lasix  CARDIOVASCULAR CVL [facility]  6/18 >> A: Severe sepsis , normal lactate , residual pressors remain, Adrenal insuff, re occurrent shock from sedation likely (as correlated well) P:  Stress steroids reduce further tele  RENAL A:   Hypernatremia P:   free water Increase lasix, was positive yet again Add d5w, remain Chem in am   GASTROINTESTINAL A:  Chronic dysphagia s/p PEG T ft goal BM noted success, a little too successful maybe - flexiseal PLAN: ppi colace , mirliax hold Lactulose dc  HEMATOLOGIC A:  DV t prev  Sub q hep maintain  INFECTIOUS A:  Severe CAP vs aspiration P:   BCx2 6/18 Sputum6/18>>>few strep PNA NEG strep  urine ag  Abx:   Ceftriaxone 6/18>>>6/20 azithromycin 6/18 >>stop date 6/22 Vanc 6/19 >>6/22 ceftaz 6/20>>>6/23 Ceftriaxone 6/23>>>stop date 8 days  Stop abx today and monitor fever curve  ENDOCRINE  A:   AI P:    Monitor CBG Stress roids reduce to 25 mg  NEUROLOGIC A:   myotonic dystrophy, vent dyschrony, deep rass resolved, delerium P:   RASS goal: 0 Fent gtt, with WUA Risperdal restart Goal to dc fent drip to int q2h dosing May need methadone  FAMILY  - Updates:  patient designated GF/ son  as Carl Galvan and sister I updatd in full 6/23  - Inter-disciplinary family meet or Palliative Care meeting due by:  6/23 done Carl Galvan  Ccm time 30  min   Mcarthur Rossetti. Tyson Alias, MD, FACP Pgr: [phone] [facility]

## 2015-02-20 ENCOUNTER — Inpatient Hospital Stay
Admission: RE | Admit: 2015-02-20 | Discharge: 2015-03-18 | Disposition: A | Discharge status: Skilled Nursing Facility | Payer: Medicare Other | Source: Other Acute Inpatient Hospital | Attending: Internal Medicine | Admitting: Internal Medicine

## 2015-02-20 ENCOUNTER — Other Ambulatory Visit (HOSPITAL_COMMUNITY): Payer: Medicare Other

## 2015-02-20 DIAGNOSIS — G7111 Myotonic muscular dystrophy: Secondary | ICD-10-CM | POA: Insufficient documentation

## 2015-02-20 DIAGNOSIS — J189 Pneumonia, unspecified organism: Secondary | ICD-10-CM

## 2015-02-20 DIAGNOSIS — Z930 Tracheostomy status: Secondary | ICD-10-CM | POA: Insufficient documentation

## 2015-02-20 DIAGNOSIS — J9601 Acute respiratory failure with hypoxia: Secondary | ICD-10-CM | POA: Insufficient documentation

## 2015-02-20 DIAGNOSIS — R410 Disorientation, unspecified: Secondary | ICD-10-CM | POA: Insufficient documentation

## 2015-02-20 DIAGNOSIS — Z431 Encounter for attention to gastrostomy: Secondary | ICD-10-CM

## 2015-02-20 DIAGNOSIS — J9690 Respiratory failure, unspecified, unspecified whether with hypoxia or hypercapnia: Secondary | ICD-10-CM

## 2015-02-20 LAB — BASIC METABOLIC PANEL
ANION GAP: 6 (ref 5–15)
BUN: 22 mg/dL — AB (ref 6–20)
CO2: 35 mmol/L — AB (ref 22–32)
Calcium: 7.6 mg/dL — ABNORMAL LOW (ref 8.9–10.3)
Chloride: 101 mmol/L (ref 101–111)
Creatinine, Ser: 0.49 mg/dL — ABNORMAL LOW (ref 0.61–1.24)
GFR calc non Af Amer: >60 mL/min (ref >60)
GLUCOSE: 150 mg/dL — AB (ref 65–99)
POTASSIUM: 3.6 mmol/L (ref 3.5–5.1)
SODIUM: 142 mmol/L (ref 135–145)

## 2015-02-20 LAB — GLUCOSE, CAPILLARY
GLUCOSE-CAPILLARY: 97 mg/dL (ref 65–99)
Glucose-Capillary: 92 mg/dL (ref 65–99)
Glucose-Capillary: 112 mg/dL — ABNORMAL HIGH (ref 65–99)
Glucose-Capillary: 110 mg/dL — ABNORMAL HIGH (ref 65–99)

## 2015-02-20 MED ORDER — HYDROCORTISONE NA SUCCINATE PF 100 MG IJ SOLR: 25.0000 mg | Freq: Two times a day (BID) | INTRAMUSCULAR | Status: AC

## 2015-02-20 MED ORDER — CHLORHEXIDINE GLUCONATE 0.12 % MT SOLN: 15.0000 mL | Freq: Two times a day (BID) | OROMUCOSAL | Status: AC

## 2015-02-20 MED ORDER — ALBUTEROL SULFATE (2.5 MG/3ML) 0.083% IN NEBU: 2.5000 mg | INHALATION_SOLUTION | RESPIRATORY_TRACT | Status: AC | PRN

## 2015-02-20 MED ORDER — RISPERIDONE 1 MG/ML PO SOLN: 1.0000 mg | Freq: Two times a day (BID) | ORAL | Status: AC

## 2015-02-20 MED ORDER — HEPARIN SODIUM (PORCINE) 5000 UNIT/ML IJ SOLN: 5000.0000 [IU] | Freq: Three times a day (TID) | INTRAMUSCULAR | Status: AC

## 2015-02-20 MED ORDER — IPRATROPIUM-ALBUTEROL 0.5-2.5 (3) MG/3ML IN SOLN: 3.0000 mL | Freq: Four times a day (QID) | RESPIRATORY_TRACT | Status: AC

## 2015-02-20 MED ORDER — VITAL AF 1.2 CAL PO LIQD: 1000.0000 mL | ORAL | Status: AC

## 2015-02-20 MED ORDER — ACETAMINOPHEN 325 MG PO TABS: 650.0000 mg | ORAL_TABLET | ORAL | Status: AC | PRN

## 2015-02-20 MED ORDER — ONDANSETRON HCL 4 MG/2ML IJ SOLN: 4.0000 mg | Freq: Four times a day (QID) | INTRAMUSCULAR | Status: AC | PRN

## 2015-02-20 MED ORDER — FUROSEMIDE 10 MG/ML IJ SOLN: 40.0000 mg | Freq: Four times a day (QID) | INTRAMUSCULAR | Status: AC

## 2015-02-20 MED ORDER — FENTANYL BOLUS VIA INFUSION: 50.0000 ug | INTRAVENOUS | Status: AC | PRN

## 2015-02-20 MED ORDER — CETYLPYRIDINIUM CHLORIDE 0.05 % MT LIQD: 7.0000 mL | Freq: Four times a day (QID) | OROMUCOSAL | Status: AC

## 2015-02-20 MED ORDER — IOHEXOL 300 MG/ML  SOLN
20.0000 mL | Freq: Once | INTRAMUSCULAR | Status: AC | PRN
  Administered 2015-02-20: 20 mL via INTRAVENOUS

## 2015-02-20 MED ORDER — ATORVASTATIN CALCIUM 40 MG PO TABS: 40.0000 mg | ORAL_TABLET | Freq: Every day | ORAL | Status: AC

## 2015-02-20 MED ORDER — PANTOPRAZOLE SODIUM 40 MG PO PACK: 40.0000 mg | PACK | Freq: Every day | ORAL | Status: AC

## 2015-02-20 MED ORDER — INSULIN ASPART 100 UNIT/ML ~~LOC~~ SOLN: 0.0000 [IU] | SUBCUTANEOUS | Status: AC

## 2015-02-20 MED ORDER — DEXTROSE 5 % IV SOLN: INTRAVENOUS | Status: AC

## 2015-02-20 MED ORDER — SODIUM CHLORIDE 0.9 % IV SOLN: 25.0000 ug/h | INTRAVENOUS | Status: AC

## 2015-02-20 MED ORDER — LORAZEPAM 2 MG/ML IJ SOLN: 1.0000 mg | INTRAMUSCULAR | Status: AC | PRN

## 2015-02-20 MED ORDER — FREE WATER: 200.0000 mL | Freq: Four times a day (QID) | Status: AC

## 2015-02-20 NOTE — Progress Notes (Signed)
Report attempted to [facility]. Awaiting call back to give report.

## 2015-02-20 NOTE — Discharge Summary (Signed)
Physician Discharge Summary       Patient ID: CARTHEL CASTILLE MRN: [id number] DOB/AGE: 1955/12/21 59 y.o.  Admit date: 02/12/2015 Discharge date: 02/20/2015  Discharge Diagnoses:   Acute on chronic hypoxic respiratory failure Chronic hypoventilation , neuromuscular  Myotonic Dystrophy Bibasal severe CAP vs aspiration PNA  Small rt effusions, some compressive atx ARDS  Severe sepsis Adrenal insufficiency  Hypernatremia  Chronic dysphagia has PEG  Detailed Hospital Course:   59 year old with myotonic dystrophy at his baseline he has percutaneous gastrostomy tube, is able to ambulate and carry out all activities of daily living by himself. He was admitted 6/18  reporting cough with minimal sputum production for 2 days. Normally uses 2 L oxygen at home-he is supposed to be on nocturnal BiPAP for hypoventilation but does not use.  He was brought in by EMS WITH SAT 86% , febrile 100.7. He required up to 6 L oxygen nasal cannula in the ED -and then was placed on nonrebreather due to desaturation. He received 4 L of fluid due to hypotension. CXR showed bibasilar airspace disease. He was intubated in the ER. Admitted to the intensive care w/ working dx of acute hypoxic respiratory failure in setting of CAP vs aspiration PNA and resultant severe sepsis. Central access was placed, empiric antibiotics were started as were vasoactive gtts. Significant events included: Pressor dependent until 6/20 ECHO which was obtained on 6/20 and showed EF 55% and estimated PAS of 35 mmHg. CT head obtained 6/21 this was negative and CT chest on 6/21 and this showed bilateral effusions; right > left, compressive atx and R>L infiltrate. Sputum samples grew out strep pneumoniae . Weaning efforts were started but we decided on early trach to facilitate aggressive weaning and keeping in mind underlying neuro-muscular disease. He underwent trach on 6/24 and weaning trials were continued. His antibiotics were completed on 6/25.  As of 6/26 he is making slow but persistent progress. He is now ready for d/c to LTAC setting where he can continue ventilator weaning and rehabilitative services. His plan of care by current problem list is outlined below.    Discharge Plan by active problems   Acute on chronic hypoxic respiratory failure in the setting of strep Pneumonia w/ resultant ARDS superimposed on Chronic neuromuscular hypoventilation. Small rt effusions, some compressive atx Plan:  Weaning to trach collar, ok to initiate [facility] weaning protocol  Lasix as BUN/creatinine and BP will allow for negative fluid volume balance  PRN CXR  Antibiotics have been completed: watch fever and WBC curve.   Hypernatremia-->this has improved Plan Continue free water supplementation especially in setting of diuresis   Chronic dysphagia  Initially constipated; now w/ diarrhea Plan Has flexi-seal Colase, lactulose and Mirilax have been held; will need to re-assess this Continue tubefeedings   Adrenal insufficiency  Plan  Continue weaning solucortef to off  myotonic dystrophy  Delirium Deconditioning  Plan:  RASS goal: 0 Fent gtt, with WUA Risperdal restarted Goal to dc fent drip to intermittent  q2h dosing May need methadone-->defer this to ssh  Significant Hospital tests/ studies    Discharge Exam: BP 113/61 mmHg  Pulse 84  Temp(Src) 98.5 F (36.9 C) (Oral)  Resp 12  Ht  (1.702 m)  Wt 90.8 kg (200 lb 2.8 oz)  BMI 31.34 kg/m2  SpO2 97%  General: Chronically ill  Neuro: rass 2, following commands with agitation HEENT: JVD, trach clean Cardiovascular: S1 and S2 RRR Lungs: Reduced, clear Abdomen: Soft nontender , PEG site clean  Musculoskeletal: Wasting, foot drop, very weak all 4 ext Skin: no rash  Labs at discharge Lab Results  Component Value Date   CREATININE 0.49* 02/20/2015   BUN 22* 02/20/2015   NA 142 02/20/2015   K 3.6 02/20/2015   CL 101 02/20/2015   CO2 35* 02/20/2015     Lab Results  Component Value Date   WBC 6.6 02/19/2015   HGB 9.7* 02/19/2015   HCT 31.2* 02/19/2015   MCV 96.9 02/19/2015   PLT 245 02/19/2015   Lab Results  Component Value Date   ALT 29 02/17/2015   AST 21 02/17/2015   ALKPHOS 74 02/17/2015   BILITOT 0.4 02/17/2015   Lab Results  Component Value Date   INR 1.05 02/17/2015   INR 1.21 02/15/2015   INR 0.98 02/27/2014    Current radiology studies Dg Chest Port 1 View  02/19/2015   CLINICAL DATA:  Pneumonia.  EXAM: PORTABLE CHEST - 1 VIEW  COMPARISON:  02/18/2015  FINDINGS: Tracheostomy tube in adequate position and unchanged. Left IJ central venous catheter has tip over the right atrium just below the cavoatrial junction.  Lungs are moderately hypoinflated with opacification over the mid to lower lungs with slight worsening in the left midlung. Findings are likely due to small bilateral pleural effusions with associated atelectasis. Cannot exclude infection. Remainder of the exam is unchanged.  IMPRESSION: Moderate hypoinflation with opacification over the mid to lower lungs bilaterally with slight worsening over the left midlung. Findings are likely due to the effusions with atelectasis, although cannot exclude infection.  Tubes and lines as described. Note the left IJ central venous catheter has tip over the right atrium.   Electronically Signed   By: Elberta Fortis M.D.   On: 02/19/2015 09:03    Disposition:  01-Home or Self Care      Discharge Instructions    Diet - low sodium heart healthy    Complete by:  As directed      Increase activity slowly    Complete by:  As directed             Medication List    STOP taking these medications        albuterol 108 (90 BASE) MCG/ACT inhaler  Commonly known as:  PROVENTIL HFA;VENTOLIN HFA  Replaced by:  albuterol (2.5 MG/3ML) 0.083% nebulizer solution     ALPRAZolam 0.5 MG tablet  Commonly known as:  XANAX     aspirin 81 MG chewable tablet     cetirizine 10 MG  tablet  Commonly known as:  ZYRTEC     methylphenidate 20 MG 24 hr capsule  Commonly known as:  RITALIN LA     NEXIUM 40 MG packet  Generic drug:  esomeprazole     OVER THE COUNTER MEDICATION     OXYGEN     polyethylene glycol packet  Commonly known as:  MIRALAX / GLYCOLAX     pravastatin 80 MG tablet  Commonly known as:  PRAVACHOL      TAKE these medications        acetaminophen 325 MG tablet  Commonly known as:  TYLENOL  Take 2 tablets (650 mg total) by mouth every 4 (four) hours as needed for mild pain (temp > 101.5).     albuterol (2.5 MG/3ML) 0.083% nebulizer solution  Commonly known as:  PROVENTIL  Take 3 mLs (2.5 mg total) by nebulization every 4 (four) hours as needed for wheezing.     antiseptic oral rinse 0.05 %  Liqd solution  Commonly known as:  CPC / CETYLPYRIDINIUM CHLORIDE 0.05%  7 mLs by Mouth Rinse route QID.     atorvastatin 40 MG tablet  Commonly known as:  LIPITOR  Place 1 tablet (40 mg total) into feeding tube daily at 6 PM.     chlorhexidine 0.12 % solution  Commonly known as:  PERIDEX  15 mLs by Mouth Rinse route 2 (two) times daily.     dextrose 5 % solution  30 ml/hr     feeding supplement (VITAL AF 1.2 CAL) Liqd  Place 1,000 mLs into feeding tube continuous.     fentaNYL 2,500 mcg in sodium chloride 0.9 % 200 mL  Inject 25 mcg/hr into the vein continuous.     fentaNYL Soln  Commonly known as:  SUBLIMAZE  Inject 50 mcg into the vein every hour as needed.     free water Soln  Place 200 mLs into feeding tube every 6 (six) hours.     furosemide 10 MG/ML injection  Commonly known as:  LASIX  Inject 4 mLs (40 mg total) into the vein every 6 (six) hours.     heparin 5000 UNIT/ML injection  Inject 1 mL (5,000 Units total) into the skin every 8 (eight) hours.     hydrocortisone sodium succinate 100 MG Solr injection  Commonly known as:  SOLU-CORTEF  Inject 0.5 mLs (25 mg total) into the vein every 12 (twelve) hours.     insulin  aspart 100 UNIT/ML injection  Commonly known as:  novoLOG  Inject 0-15 Units into the skin every 4 (four) hours.     ipratropium-albuterol 0.5-2.5 (3) MG/3ML Soln  Commonly known as:  DUONEB  Take 3 mLs by nebulization every 6 (six) hours.     LORazepam 2 MG/ML injection  Commonly known as:  ATIVAN  Inject 0.5 mLs (1 mg total) into the vein every 4 (four) hours as needed for anxiety.     ondansetron 4 MG/2ML Soln injection  Commonly known as:  ZOFRAN  Inject 2 mLs (4 mg total) into the vein every 6 (six) hours as needed for nausea.     pantoprazole sodium 40 mg/20 mL Pack  Commonly known as:  PROTONIX  Place 20 mLs (40 mg total) into feeding tube daily.     risperiDONE 1 MG/ML oral solution  Commonly known as:  RISPERDAL  Take 1 mL (1 mg total) by mouth 2 (two) times daily.         Discharged Condition: fair  Physician Statement:   The Patient was personally examined, the discharge assessment and plan has been personally reviewed and I agree with ACNP Babcock's assessment and plan. > 30 minutes of time have been dedicated to discharge assessment, planning and discharge instructions.   Signed: BABCOCK,PETE 02/20/2015, 3:15 PM   Examined and agree with above STAFF NOTE: I, Rory Percy, MD FACP have personally reviewed patient's available data, including medical history, events of note, physical examination and test results as part of my evaluation. I have discussed with resident/NP and other care providers such as pharmacist, RN and RRT. In addition, I personally evaluated patient and elicited key findings of: more awake and comfortable with trach, escalate to TC as able, abx course, off fent drip is goal if able, risp increased, to select  Mcarthur Rossetti. Tyson Alias, MD, FACP Pgr: [phone] [facility] 02/20/2015 3:32 PM

## 2015-02-20 NOTE — Progress Notes (Signed)
CM spoke with Carl Galvan of [facility] and gave her Carl Galvan (pt's sister Carl Galvan [phone] and [phone]). [name] gave me receiving MD is Dr. Stanton Kidney and pt will be going to room 5735.  CM called [facility] to relay information so report can be called to [facility].  No other CM needs were communicated.

## 2015-02-20 NOTE — Progress Notes (Signed)
Report called to nurse. Vitals stable. Medications gathered and will be sent with patient.

## 2015-02-20 NOTE — Progress Notes (Signed)
Pt transferred to [facility]. Pt manually ventilated on transport. Report given at bedside to receiving RT.

## 2015-02-21 LAB — BASIC METABOLIC PANEL
Anion gap: 5 (ref 5–15)
BUN: 12 mg/dL (ref 6–20)
CHLORIDE: 101 mmol/L (ref 101–111)
CO2: 39 mmol/L — AB (ref 22–32)
Calcium: 8.6 mg/dL — ABNORMAL LOW (ref 8.9–10.3)
Creatinine, Ser: 0.53 mg/dL — ABNORMAL LOW (ref 0.61–1.24)
GFR calc Af Amer: >60 mL/min (ref >60)
Glucose, Bld: 94 mg/dL (ref 65–99)
Potassium: 3 mmol/L — ABNORMAL LOW (ref 3.5–5.1)
SODIUM: 145 mmol/L (ref 135–145)

## 2015-02-21 LAB — CBC
HCT: 34.7 % — ABNORMAL LOW (ref 39.0–52.0)
HEMOGLOBIN: 11.1 g/dL — AB (ref 13.0–17.0)
MCH: 30.3 pg (ref 26.0–34.0)
MCHC: 32 g/dL (ref 30.0–36.0)
MCV: 94.8 fL (ref 78.0–100.0)
Platelets: 261 10*3/uL (ref 150–400)
RBC: 3.66 MIL/uL — ABNORMAL LOW (ref 4.22–5.81)
RDW: 14.7 % (ref 11.5–15.5)
WBC: 8.7 10*3/uL (ref 4.0–10.5)

## 2015-02-21 LAB — CLOSTRIDIUM DIFFICILE BY PCR: CDIFFPCR: NEGATIVE

## 2015-02-22 DIAGNOSIS — Z930 Tracheostomy status: Secondary | ICD-10-CM | POA: Insufficient documentation

## 2015-02-22 DIAGNOSIS — G7111 Myotonic muscular dystrophy: Secondary | ICD-10-CM | POA: Insufficient documentation

## 2015-02-22 DIAGNOSIS — E2740 Unspecified adrenocortical insufficiency: Secondary | ICD-10-CM

## 2015-02-22 DIAGNOSIS — J9601 Acute respiratory failure with hypoxia: Secondary | ICD-10-CM

## 2015-02-22 DIAGNOSIS — R410 Disorientation, unspecified: Secondary | ICD-10-CM

## 2015-02-22 LAB — BASIC METABOLIC PANEL
Anion gap: 6 (ref 5–15)
BUN: 17 mg/dL (ref 6–20)
CO2: 41 mmol/L — AB (ref 22–32)
CREATININE: 0.6 mg/dL — AB (ref 0.61–1.24)
Calcium: 8.8 mg/dL — ABNORMAL LOW (ref 8.9–10.3)
Chloride: 96 mmol/L — ABNORMAL LOW (ref 101–111)
GFR calc Af Amer: >60 mL/min (ref >60)
GFR calc non Af Amer: >60 mL/min (ref >60)
Glucose, Bld: 123 mg/dL — ABNORMAL HIGH (ref 65–99)
Potassium: 3.5 mmol/L (ref 3.5–5.1)
Sodium: 143 mmol/L (ref 135–145)

## 2015-02-22 NOTE — Consult Note (Signed)
Name: Carl Galvan MRN: [id number] DOB: 31-Dec-1955    ADMISSION DATE:  02/20/2015 CONSULTATION DATE:  6/28  REFERRING MD :  [facility]  CHIEF COMPLAINT:  VDRF  BRIEF PATIENT DESCRIPTION:   59 year old with myotonic dystrophy presents with bibasal pneumonia and hypoxic respiratory failure and septic shock . Treated at [facility] and transfer to [facility]    STUDIES:  6/20 echo- 55%, pa 37 6/21 Ct head>>neg acute 6/21 ct chest>>>bilat effusions small rt greater left, compressive atx rt, infiltrate rt greater left  SIGNIFICANT EVENTS: 6/18 intubated - designated GF & son as POA, Copious secretions 6/19- pressors remained 6/24 trach 6/28 now on ssh  HISTORY OF PRESENT ILLNESS:   59 year old with myotonic dystrophy at his baseline he has percutaneous gastrostomy tube, is able to ambulate and carry out all activities of daily living by himself. He was admitted 6/18 reporting cough with minimal sputum production for 2 days. Normally uses 2 L oxygen at home-he is supposed to be on nocturnal BiPAP for hypoventilation but does not use. He was brought in by EMS WITH SAT 86% , febrile 100.7. He required up to 6 L oxygen nasal cannula in the ED -and then was placed on nonrebreather due to desaturation. He received 4 L of fluid due to hypotension. CXR showed bibasilar airspace disease. He was intubated in the ER. Admitted to the intensive care w/ working dx of acute hypoxic respiratory failure in setting of CAP vs aspiration PNA and resultant severe sepsis. Central access was placed, empiric antibiotics were started as were vasoactive gtts. Significant events included: Pressor dependent until 6/20 ECHO which was obtained on 6/20 and showed EF 55% and estimated PAS of 35 mmHg. CT head obtained 6/21 this was negative and CT chest on 6/21 and this showed bilateral effusions; right > left, compressive atx and R>L infiltrate. Sputum samples grew out strep pneumoniae . Weaning efforts were started but we decided on early  trach to facilitate aggressive weaning and keeping in mind underlying neuro-muscular disease. He underwent trach on 6/24 and weaning trials were continued. His antibiotics were completed on 6/25. As of 6/26 he is making slow but persistent progress. He is now ready for d/c to LTAC setting where he can continue ventilator weaning and rehabilitative services.  PAST MEDICAL HISTORY :   has a past medical history of Muscular dystrophy.  has past surgical history that includes Cholecystectomy. Prior to Admission medications   Medication Sig Start Date End Date Taking? Authorizing Provider  acetaminophen (TYLENOL) 325 MG tablet Take 2 tablets (650 mg total) by mouth every 4 (four) hours as needed for mild pain (temp > 101.5). 02/20/15   Simonne Martinet, NP  albuterol (PROVENTIL) (2.5 MG/3ML) 0.083% nebulizer solution Take 3 mLs (2.5 mg total) by nebulization every 4 (four) hours as needed for wheezing. 02/20/15   Simonne Martinet, NP  antiseptic oral rinse (CPC / CETYLPYRIDINIUM CHLORIDE 0.05%) 0.05 % LIQD solution 7 mLs by Mouth Rinse route QID. 02/20/15   Simonne Martinet, NP  atorvastatin (LIPITOR) 40 MG tablet Place 1 tablet (40 mg total) into feeding tube daily at 6 PM. 02/20/15   Simonne Martinet, NP  chlorhexidine (PERIDEX) 0.12 % solution 15 mLs by Mouth Rinse route 2 (two) times daily. 02/20/15   Simonne Martinet, NP  dextrose 5 % solution 30 ml/hr 02/20/15   Simonne Martinet, NP  fentaNYL (SUBLIMAZE) SOLN Inject 50 mcg into the vein every hour as needed. 02/20/15   Simonne Martinet,  NP  fentaNYL 2,500 mcg in sodium chloride 0.9 % 200 mL Inject 25 mcg/hr into the vein continuous. 02/20/15   Simonne Martinet, NP  furosemide (LASIX) 10 MG/ML injection Inject 4 mLs (40 mg total) into the vein every 6 (six) hours. 02/20/15   Simonne Martinet, NP  heparin 5000 UNIT/ML injection Inject 1 mL (5,000 Units total) into the skin every 8 (eight) hours. 02/20/15   Simonne Martinet, NP  hydrocortisone sodium succinate  (SOLU-CORTEF) 100 MG SOLR injection Inject 0.5 mLs (25 mg total) into the vein every 12 (twelve) hours. 02/20/15   Simonne Martinet, NP  insulin aspart (NOVOLOG) 100 UNIT/ML injection Inject 0-15 Units into the skin every 4 (four) hours. 02/20/15   Simonne Martinet, NP  ipratropium-albuterol (DUONEB) 0.5-2.5 (3) MG/3ML SOLN Take 3 mLs by nebulization every 6 (six) hours. 02/20/15   Simonne Martinet, NP  LORazepam (ATIVAN) 2 MG/ML injection Inject 0.5 mLs (1 mg total) into the vein every 4 (four) hours as needed for anxiety. 02/20/15   Simonne Martinet, NP  Nutritional Supplements (FEEDING SUPPLEMENT, VITAL AF 1.2 CAL,) LIQD Place 1,000 mLs into feeding tube continuous. 02/20/15   Simonne Martinet, NP  ondansetron [facility]) 4 MG/2ML SOLN injection Inject 2 mLs (4 mg total) into the vein every 6 (six) hours as needed for nausea. 02/20/15   Simonne Martinet, NP  pantoprazole sodium (PROTONIX) 40 mg/20 mL PACK Place 20 mLs (40 mg total) into feeding tube daily. 02/20/15   Simonne Martinet, NP  risperiDONE (RISPERDAL) 1 MG/ML oral solution Take 1 mL (1 mg total) by mouth 2 (two) times daily. 02/20/15   Simonne Martinet, NP  Water For Irrigation, Sterile (FREE WATER) SOLN Place 200 mLs into feeding tube every 6 (six) hours. 02/20/15   Simonne Martinet, NP   No Known Allergies  FAMILY HISTORY:  family history is not on file. SOCIAL HISTORY:  reports that he has never smoked. He does not have any smokeless tobacco history on file. He reports that he drinks alcohol. He reports that he does not use illicit drugs.  REVIEW OF SYSTEMS:   na  SUBJECTIVE:   VITAL SIGNS:  126/74 87 18 98.7 98%  PHYSICAL EXAMINATION: General: Chronically ill  Neuro: Rass-1 difficult to arouse HEENT: JVD, trach clean Cardiovascular: S1 and S2 RRR Lungs: Reduced, clear Abdomen: Soft nontender , PEG site clean  Musculoskeletal: Wasting, foot drop Skin: no rash   Recent Labs Lab 02/20/15 0500 02/21/15 0631 02/22/15 0630    NA 142 145 143  K 3.6 3.0* 3.5  CL 101 101 96*  CO2 35* 39* 41*  BUN 22* 12 17  CREATININE 0.49* 0.53* 0.60*  GLUCOSE 150* 94 123*    Recent Labs Lab 02/18/15 0549 02/19/15 0350 02/21/15 0631  HGB 9.4* 9.7* 11.1*  HCT 30.7* 31.2* 34.7*  WBC 5.6 6.6 8.7  PLT 224 245 261   Dg Abd Portable 1v  02/20/2015   CLINICAL DATA:  PEG adjustment, replacement.  EXAM: PORTABLE ABDOMEN - 1 VIEW  COMPARISON:  None.  FINDINGS: Twenty mL enteric contrast administered through indwelling gastrostomy tube prior to the exam. There is contrast opacifying the stomach. No evidence of extravasation or leak. There is a normal bowel gas pattern.  IMPRESSION: Contrast opacifying the stomach without evidence of extravasation or leak.   Electronically Signed   By: Rubye Oaks M.D.   On: 02/20/2015 17:20    ASSESSMENT / PLAN:  Acute on chronic hypoxic respiratory failure in the setting of strep Pneumonia w/ resultant ARDS superimposed on Chronic neuromuscular hypoventilation. Small rt effusions, some compressive atx Plan:  Weaning to trach collar, ok [facility] currently goal is 12 hours of PS. Lasix as BUN/creatinine and BP will allow for negative fluid volume balance  PRN CXR  Antibiotics have been completed: watch fever and WBC curve.   Hypernatremia-->this has improved Plan Continue free water. Follow BMET. Replace electrolytes as indicated.  Chronic dysphagia  Initially constipated; now w/ diarrhea Plan Has flexi-seal Colase, lactulose and Mirilax have been held; will need to re-assess this Continue tubefeedings   Adrenal insufficiency  Plan  Continue weaning solucortef to off but monitor for signs of adrenal insufficiency closely, patient was on steroids for quite sometime.  myotonic dystrophy  Delirium Deconditioning  Plan:  RASS goal: 0 Fent gtt, with WUA Risperdal restarted Goal to dc fent drip to intermittent q2h dosing May need  methadone if we are hoping to get him of the drip and actively wean him.  Above note edited in full.  Patient seen and examined, agree with above note.  I dictated the care and orders written for this patient under my direction.  Alyson ReedyWesam G Goodwin [place], MD [phone]

## 2015-02-23 LAB — HEMOGLOBIN A1C
Hgb A1c MFr Bld: 5.6 % (ref 4.8–5.6)
Mean Plasma Glucose: 114 mg/dL

## 2015-02-24 DIAGNOSIS — Z930 Tracheostomy status: Secondary | ICD-10-CM

## 2015-02-24 LAB — CBC
HCT: 35.8 % — ABNORMAL LOW (ref 39.0–52.0)
HEMOGLOBIN: 11.6 g/dL — AB (ref 13.0–17.0)
MCH: 30.8 pg (ref 26.0–34.0)
MCHC: 32.4 g/dL (ref 30.0–36.0)
MCV: 95 fL (ref 78.0–100.0)
Platelets: 324 10*3/uL (ref 150–400)
RBC: 3.77 MIL/uL — ABNORMAL LOW (ref 4.22–5.81)
RDW: 14.4 % (ref 11.5–15.5)
WBC: 6.3 10*3/uL (ref 4.0–10.5)

## 2015-02-24 LAB — BASIC METABOLIC PANEL
ANION GAP: 10 (ref 5–15)
BUN: 21 mg/dL — ABNORMAL HIGH (ref 6–20)
CALCIUM: 10.2 mg/dL (ref 8.9–10.3)
CHLORIDE: 91 mmol/L — AB (ref 101–111)
CO2: 42 mmol/L — ABNORMAL HIGH (ref 22–32)
Creatinine, Ser: 0.67 mg/dL (ref 0.61–1.24)
GFR calc Af Amer: >60 mL/min (ref >60)
GLUCOSE: 104 mg/dL — AB (ref 65–99)
Potassium: 3.1 mmol/L — ABNORMAL LOW (ref 3.5–5.1)
Sodium: 143 mmol/L (ref 135–145)

## 2015-02-24 NOTE — Progress Notes (Signed)
Name: Carl Galvan MRN: [id number] DOB: [birth year]    ADMISSION DATE:  02/20/2015 CONSULTATION DATE:  6/28  REFERRING MD :  [facility]  CHIEF COMPLAINT:  VDRF  BRIEF PATIENT DESCRIPTION:   59 year old with myotonic dystrophy presents with bibasal pneumonia and hypoxic respiratory failure and septic shock . Treated at [facility] and transfer to [facility]    STUDIES:  6/20 echo- 55%, pa 37 6/21 Ct head>>neg acute 6/21 ct chest>>>bilat effusions small rt greater left, compressive atx rt, infiltrate rt greater left  SIGNIFICANT EVENTS: 6/18 intubated - designated GF & son as POA, Copious secretions 6/19- pressors remained 6/24 trach 6/28 now on ssh  HISTORY OF PRESENT ILLNESS:   59 year old with myotonic dystrophy at his baseline he has percutaneous gastrostomy tube, is able to ambulate and carry out all activities of daily living by himself. He was admitted 6/18 reporting cough with minimal sputum production for 2 days. Normally uses 2 L oxygen at home-he is supposed to be on nocturnal BiPAP for hypoventilation but does not use. He was brought in by EMS WITH SAT 86% , febrile 100.7. He required up to 6 L oxygen nasal cannula in the ED -and then was placed on nonrebreather due to desaturation. He received 4 L of fluid due to hypotension. CXR showed bibasilar airspace disease. He was intubated in the ER. Admitted to the intensive care w/ working dx of acute hypoxic respiratory failure in setting of CAP vs aspiration PNA and resultant severe sepsis. Central access was placed, empiric antibiotics were started as were vasoactive gtts. Significant events included: Pressor dependent until 6/20 ECHO which was obtained on 6/20 and showed EF 55% and estimated PAS of 35 mmHg. CT head obtained 6/21 this was negative and CT chest on 6/21 and this showed bilateral effusions; right > left, compressive atx and R>L infiltrate. Sputum samples grew out strep pneumoniae . Weaning efforts were started but we decided on early  trach to facilitate aggressive weaning and keeping in mind underlying neuro-muscular disease. He underwent trach on 6/24 and weaning trials were continued. His antibiotics were completed on 6/25. As of 6/26 he is making slow but persistent progress. He is now ready for d/c to LTAC setting where he can continue ventilator weaning and rehabilitative services.    SUBJECTIVE:  More Awake   VITAL SIGNS:  97.6 77 18 96/62 97%  PHYSICAL EXAMINATION: General: Chronically ill  Neuro: Rass 1 follows commands HEENT: JVD, trach clean Cardiovascular: S1 and S2 RRR Lungs: Reduced, clear Abdomen: Soft nontender , PEG site clean  Musculoskeletal: Wasting, foot drop Skin: no rash   Recent Labs Lab 02/21/15 0631 02/22/15 0630 02/24/15 0616  NA 145 143 143  K 3.0* 3.5 3.1*  CL 101 96* 91*  CO2 39* 41* 42*  BUN 12 17 21*  CREATININE 0.53* 0.60* 0.67  GLUCOSE 94 123* 104*    Recent Labs Lab 02/19/15 0350 02/21/15 0631 02/24/15 0616  HGB 9.7* 11.1* 11.6*  HCT 31.2* 34.7* 35.8*  WBC 6.6 8.7 6.3  PLT 245 261 324   No results found.  ASSESSMENT / PLAN:    Acute on chronic hypoxic respiratory failure in the setting of strep Pneumonia w/ resultant ARDS superimposed on Chronic neuromuscular hypoventilation. Small rt effusions, some compressive atx Plan:  Weaning to trach collar, ok to initiate [facility] weaning protocol currently goal is t collar as tolerated.    Hypernatremia-->this has improved Plan Per ssh  Chronic dysphagia  Initially constipated; now w/ diarrhea Plan Per ssh  Adrenal insufficiency  Plan  Per ssh  myotonic dystrophy  Delirium Deconditioning  Plan:  Per ssh.  Brett CanalesSteve Minor ACNP [facility] Pager [phone] till 3 pm If no answer page [phone] 02/24/2015, 8:32 AM  Attending Note  59 year old male with myotonic dystrophy presenting with PNA and hypoxic respiratory failure and septic shock.  I reviewed CXR myself trach in good position  and bibasilar infiltrate.  Discussed with SSH-MD and PCCM-MD and bedside RT.  Acute on chronic hypoxic respiratory failure in the setting of strep Pneumonia w/ resultant ARDS superimposed on Chronic neuromuscular hypoventilation. Small rt effusions, some compressive atx Plan:  TC for 5 hours at 40% tolerated but then started desaturating. Lasix as BUN/creatinine and BP will allow for negative fluid volume balance  PRN CXR  Antibiotics have been completed: watch fever and WBC curve.   Hypernatremia-->this has improved, Na is down to 143. Plan Continue free water. Follow BMET. Replace electrolytes as indicated.  Chronic dysphagia  Initially constipated; now w/ diarrhea Plan Has flexi-seal Colase, lactulose and Mirilax have been held; will need to re-assess this Continue tubefeedings   Adrenal insufficiency  Plan  Continue weaning solucortef to off but monitor for signs of adrenal insufficiency closely, patient was on steroids for quite sometime.  myotonic dystrophy  Delirium Deconditioning  Plan:  RASS goal: 0 Fent gtt, with WUA Risperdal restarted Goal to dc fent drip to intermittent q2h dosing May need methadone if we are hoping to get him of the drip and actively wean him.  Patient seen and examined, agree with above note.  I dictated the care and orders written for this patient under my direction.  Alyson ReedyWesam G Celia Friedland, MD [phone]

## 2015-02-25 LAB — POTASSIUM: Potassium: 3.8 mmol/L (ref 3.5–5.1)

## 2015-03-03 DIAGNOSIS — J9601 Acute respiratory failure with hypoxia: Secondary | ICD-10-CM | POA: Diagnosis not present

## 2015-03-03 DIAGNOSIS — R410 Disorientation, unspecified: Secondary | ICD-10-CM | POA: Diagnosis not present

## 2015-03-03 NOTE — Progress Notes (Addendum)
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 He was admitted 6/18 reporting cough with minimal sputum production for 2 days. Normally uses 2 L oxygen at home-he is supposed to be on nocturnal BiPAP for hypoventilation but does not use. He was brought in by EMS WITH SAT 86% , febrile 100.7. He required up to 6 L oxygen nasal cannula in the ED -and then was placed on nonrebreather due to desaturation. He received 4 L of fluid due to hypotension. CXR showed bibasilar airspace disease. He was intubated in the ER. Admitted to the intensive care w/ working dx of acute hypoxic respiratory failure in setting of CAP vs aspiration PNA and resultant severe sepsis. Central access was placed, empiric antibiotics were started as were vasoactive gtts. Significant events included: Pressor dependent until 6/20 ECHO which was obtained on 6/20 and showed EF 55% and estimated PAS of 35 mmHg. CT head obtained 6/21 this was negative and CT chest on 6/21 and this showed bilateral effusions; right > left, compressive atx and R>L infiltrate. Sputum samples grew out strep pneumoniae . Weaning efforts were started but we decided on early  trach to facilitate aggressive weaning and keeping in mind underlying neuro-muscular disease. He underwent trach on 6/24 and weaning trials were continued. His antibiotics were completed on 6/25. As of 6/26 he is making slow but persistent progress. He is now ready for d/c to LTAC setting where he can continue ventilator weaning and rehabilitative services.    SUBJECTIVE:  Restless  VITAL SIGNS: 134/76 98 22  96% 98.9  PHYSICAL EXAMINATION: General: Chronically ill  Neuro: Rass-1 difficult to arouse, no follws commands HEENT: JVD, trach clean Cardiovascular: S1 and S2 RRR Lungs: Reduced, clear Abdomen: Soft nontender , PEG site clean  Musculoskeletal: Wasting, foot drop Skin: no rash   Recent Labs Lab 02/25/15 0614  K 3.8   No results for input(s): HGB, HCT, WBC, PLT in the last 168 hours. No results found.  ASSESSMENT / PLAN:    Acute on chronic hypoxic respiratory failure in the setting of strep Pneumonia w/ resultant ARDS superimposed on Chronic neuromuscular hypoventilation. Small rt effusions, some compressive atx Plan:  Weaning to trach collar, [facility] currently goal is ps/tcollar as tolerated. Lasix as BUN/creatinine and BP will allow for negative fluid volume balance  PRN CXR   Carl Galvan ACNP Carl Galvan PCCM Pager [phone] till 3 pm If no answer page [phone] 03/03/2015, 8:10 AM   STAFF NOTE: I, Carl Percyaniel Feinstein, MD FACP have personally reviewed patient's available data, including medical history, events of note, physical examination and test results as part of my evaluation. I have discussed with resident/NP and other care providers such  as pharmacist, RN and RRT. In addition, I personally evaluated patient and elicited key findings of: trach collar goal today 20 hours, secretions seem to be present still, unable to escalate to 24 yet given O 2 needs and recent desat, awake, lung apical clear, i do not think he will be a  decannulation candidate, consider pcxr and labs in am, assess for atx with intermittent steps backwards on TC trials, PMV attempts slow, need to escalate this as tolerated  Carl Galvan. Carl Alias, MD, FACP Pgr: [phone] [facility] 03/03/2015 2:47 PM

## 2015-03-04 ENCOUNTER — Other Ambulatory Visit (HOSPITAL_COMMUNITY): Payer: Medicare Other

## 2015-03-04 LAB — CBC
HEMATOCRIT: 37.4 % — AB (ref 39.0–52.0)
Hemoglobin: 12 g/dL — ABNORMAL LOW (ref 13.0–17.0)
MCH: 30.1 pg (ref 26.0–34.0)
MCHC: 32.1 g/dL (ref 30.0–36.0)
MCV: 93.7 fL (ref 78.0–100.0)
Platelets: 293 10*3/uL (ref 150–400)
RBC: 3.99 MIL/uL — ABNORMAL LOW (ref 4.22–5.81)
RDW: 13.8 % (ref 11.5–15.5)
WBC: 8.3 10*3/uL (ref 4.0–10.5)

## 2015-03-04 LAB — BASIC METABOLIC PANEL
Anion gap: 11 (ref 5–15)
BUN: 33 mg/dL — AB (ref 6–20)
CALCIUM: 9.7 mg/dL (ref 8.9–10.3)
CO2: 36 mmol/L — ABNORMAL HIGH (ref 22–32)
CREATININE: 0.64 mg/dL (ref 0.61–1.24)
Chloride: 97 mmol/L — ABNORMAL LOW (ref 101–111)
GFR calc Af Amer: >60 mL/min (ref >60)
GFR calc non Af Amer: >60 mL/min (ref >60)
GLUCOSE: 117 mg/dL — AB (ref 65–99)
Potassium: 3.1 mmol/L — ABNORMAL LOW (ref 3.5–5.1)
Sodium: 144 mmol/L (ref 135–145)

## 2015-03-07 ENCOUNTER — Other Ambulatory Visit (HOSPITAL_COMMUNITY): Payer: Medicare Other

## 2015-03-07 DIAGNOSIS — G7111 Myotonic muscular dystrophy: Secondary | ICD-10-CM | POA: Diagnosis not present

## 2015-03-07 DIAGNOSIS — Z930 Tracheostomy status: Secondary | ICD-10-CM | POA: Diagnosis not present

## 2015-03-07 DIAGNOSIS — J9601 Acute respiratory failure with hypoxia: Secondary | ICD-10-CM | POA: Diagnosis not present

## 2015-03-07 LAB — BASIC METABOLIC PANEL
Anion gap: 7 (ref 5–15)
BUN: 29 mg/dL — ABNORMAL HIGH (ref 6–20)
CALCIUM: 8.8 mg/dL — AB (ref 8.9–10.3)
CHLORIDE: 94 mmol/L — AB (ref 101–111)
CO2: 40 mmol/L — ABNORMAL HIGH (ref 22–32)
Creatinine, Ser: 0.65 mg/dL (ref 0.61–1.24)
GFR calc Af Amer: >60 mL/min (ref >60)
GFR calc non Af Amer: >60 mL/min (ref >60)
Glucose, Bld: 119 mg/dL — ABNORMAL HIGH (ref 65–99)
Potassium: 2.9 mmol/L — ABNORMAL LOW (ref 3.5–5.1)
Sodium: 141 mmol/L (ref 135–145)

## 2015-03-07 LAB — POTASSIUM: POTASSIUM: 4.3 mmol/L (ref 3.5–5.1)

## 2015-03-07 LAB — CBC
HEMATOCRIT: 35.3 % — AB (ref 39.0–52.0)
Hemoglobin: 11.3 g/dL — ABNORMAL LOW (ref 13.0–17.0)
MCH: 30.1 pg (ref 26.0–34.0)
MCHC: 32 g/dL (ref 30.0–36.0)
MCV: 93.9 fL (ref 78.0–100.0)
PLATELETS: 271 10*3/uL (ref 150–400)
RBC: 3.76 MIL/uL — ABNORMAL LOW (ref 4.22–5.81)
RDW: 13.9 % (ref 11.5–15.5)
WBC: 6.9 10*3/uL (ref 4.0–10.5)

## 2015-03-07 NOTE — Progress Notes (Signed)
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 He was admitted 6/18 reporting cough with minimal sputum production for 2 days. Normally uses 2 L oxygen at home-he is supposed to be on nocturnal BiPAP for hypoventilation but does not use. He was brought in by EMS WITH SAT 86% , febrile 100.7. He required up to 6 L oxygen nasal cannula in the ED -and then was placed on nonrebreather due to desaturation. He received 4 L of fluid due to hypotension. CXR showed bibasilar airspace disease. He was intubated in the ER. Admitted to the intensive care w/ working dx of acute hypoxic respiratory failure in setting of CAP vs aspiration PNA and resultant severe sepsis. Central access was placed, empiric antibiotics were started as were vasoactive gtts. Significant events included: Pressor dependent until 6/20 ECHO which was obtained on 6/20 and showed EF 55% and estimated PAS of 35 mmHg. CT head obtained 6/21 this was negative and CT chest on 6/21 and this showed bilateral effusions; right > left, compressive atx and R>L infiltrate. Sputum samples grew out strep pneumoniae . Weaning efforts were started but we decided on early  trach to facilitate aggressive weaning and keeping in mind underlying neuro-muscular disease. He underwent trach on 6/24 and weaning trials were continued. His antibiotics were completed on 6/25. As of 6/26 he is making slow but persistent progress. He is now ready for d/c to LTAC setting where he can continue ventilator weaning and rehabilitative services.    SUBJECTIVE:  Restless  VITAL SIGNS: 134/76 98 22  96% 98.9  PHYSICAL EXAMINATION: General: Chronically ill  Neuro: Rass-1 difficult to arouse, no follws commands HEENT: JVD, trach clean Cardiovascular: S1 and S2 RRR Lungs: Reduced, clear Abdomen: Soft nontender , PEG site clean  Musculoskeletal: Wasting, foot drop Skin: no rash   Recent Labs Lab 03/04/15 0633 03/07/15 0650  NA 144 141  K 3.1* 2.9*  CL 97* 94*  CO2 36* 40*  BUN 33* 29*  CREATININE 0.64 0.65  GLUCOSE 117* 119*    Recent Labs Lab 03/04/15 0633 03/07/15 0650  HGB 12.0* 11.3*  HCT 37.4* 35.3*  WBC 8.3 6.9  PLT 293 271   Dg Chest Port 1 View  03/07/2015   CLINICAL DATA:  Acute respiratory failure with hypoxia, tracheostomy patient.  EXAM: PORTABLE CHEST - 1 VIEW  COMPARISON:  Portable chest x-ray of March 03, 2014  FINDINGS: The lungs remain hypoinflated. The heart is normal in size. The tracheostomy appliance tip lies at the level of the clavicular heads. The left internal jugular venous catheter tip projects over the junction of the middle and distal portions of the SVC. There is no pleural effusion or pneumothorax. The bony thorax exhibits  no acute abnormality.  IMPRESSION: Bilateral hypo inflation not significantly changed since the earlier study.   Electronically Signed   By: David  Swazil[place] M.D.   On: 03/07/2015 07:28    ASSESSMENT / PLAN:    Acute on chronic hypoxic respiratory failure in the setting of strep Pneumonia w/ resultant ARDS superimposed on Chronic neuromuscular hypoventilation. Small rt effusions, some compressive  atx Plan:  Weaning to trach collar, [facility] currently goal is ps/tcollar as tolerated. Lasix as BUN/creatinine and BP will allow for negative fluid volume balance  PRN CXR   Attending Note  59 year old male with myotonic dystrophy presenting with PNA and hypoxic respiratory failure and septic shock.  I reviewed CXR myself trach in good position and bibasilar infiltrate.  Discussed with SSH-MD and PCCM-MD and bedside RT.  Acute on chronic hypoxic respiratory failure in the setting of strep Pneumonia w/ resultant ARDS superimposed on Chronic neuromuscular hypoventilation. Small rt effusions, some compressive atx Plan:  TC 24-7 at this point. Lasix as BUN/creatinine and BP will allow for negative fluid volume balance  PRN CXR  Antibiotics have been completed: watch fever and WBC curve.  No decannulation until muscular strength returns.  Hypernatremia-->this has improved, Na is down to 141. Plan Continue free water. Follow BMET. Replace electrolytes as indicated.  Chronic dysphagia  Initially constipated; now w/ diarrhea Plan Has flexi-seal Colase, lactulose and Mirilax have been held; will need to re-assess this Continue tubefeedings   Adrenal insufficiency  Plan  Continue weaning solucortef to off but monitor for signs of adrenal insufficiency closely, patient was on steroids for quite sometime.  Myotonic dystrophy  Delirium Deconditioning  Plan:  RASS goal: 0 Fent gtt, with WUA Risperdal restarted Goal to dc fent drip to intermittent q2h dosing May need methadone if we are hoping to get him of the drip and actively wean him.  Alyson ReedyWesam G. Yacoub, M.D. [facility]. Pager: [phone]. After hours pager: [phone].  03/07/2015 2:12 PM

## 2015-03-10 DIAGNOSIS — Z930 Tracheostomy status: Secondary | ICD-10-CM | POA: Diagnosis not present

## 2015-03-10 DIAGNOSIS — J9601 Acute respiratory failure with hypoxia: Secondary | ICD-10-CM | POA: Diagnosis not present

## 2015-03-10 DIAGNOSIS — R410 Disorientation, unspecified: Secondary | ICD-10-CM | POA: Diagnosis not present

## 2015-03-10 DIAGNOSIS — G7111 Myotonic muscular dystrophy: Secondary | ICD-10-CM | POA: Diagnosis not present

## 2015-03-10 NOTE — Progress Notes (Signed)
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 He was admitted 6/18 reporting cough with minimal sputum production for 2 days. Normally uses 2 L oxygen at home-he is supposed to be on nocturnal BiPAP for hypoventilation but does not use. He was brought in by EMS WITH SAT 86% , febrile 100.7. He required up to 6 L oxygen nasal cannula in the ED -and then was placed on nonrebreather due to desaturation. He received 4 L of fluid due to hypotension. CXR showed bibasilar airspace disease. He was intubated in the ER. Admitted to the intensive care w/ working dx of acute hypoxic respiratory failure in setting of CAP vs aspiration PNA and resultant severe sepsis. Central access was placed, empiric antibiotics were started as were vasoactive gtts. Significant events included: Pressor dependent until 6/20 ECHO which was obtained on 6/20 and showed EF 55% and estimated PAS of 35 mmHg. CT head obtained 6/21 this was negative and CT chest on 6/21 and this showed bilateral effusions; right > left, compressive atx and R>L infiltrate. Sputum samples grew out strep pneumoniae . Weaning efforts were started but we decided on early  trach to facilitate aggressive weaning and keeping in mind underlying neuro-muscular disease. He underwent trach on 6/24 and weaning trials were continued. His antibiotics were completed on 6/25. As of 6/26 he is making slow but persistent progress. He is now ready for d/c to LTAC setting where he can continue ventilator weaning and rehabilitative services.    SUBJECTIVE:  Restless  VITAL SIGNS: 134/76 98 22  96% 98.9  PHYSICAL EXAMINATION: General: Chronically ill  Neuro: Rass-1 difficult to arouse, no follws commands HEENT: JVD, trach clean Cardiovascular: S1 and S2 RRR Lungs: Reduced, clear Abdomen: Soft nontender , PEG site clean  Musculoskeletal: Wasting, foot drop Skin: no rash   Recent Labs Lab 03/04/15 0633 03/07/15 0650 03/07/15 1826  NA 144 141  --   K 3.1* 2.9* 4.3  CL 97* 94*  --   CO2 36* 40*  --   BUN 33* 29*  --   CREATININE 0.64 0.65  --   GLUCOSE 117* 119*  --     Recent Labs Lab 03/04/15 0633 03/07/15 0650  HGB 12.0* 11.3*  HCT 37.4* 35.3*  WBC 8.3 6.9  PLT 293 271   No results found.  ASSESSMENT / PLAN:    Acute on chronic hypoxic respiratory failure in the setting of strep Pneumonia w/ resultant ARDS superimposed on Chronic neuromuscular hypoventilation. Small rt effusions, some compressive atx Plan:  Weaning to trach collar 24/7. 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  I reviewed CXR myself trach in good position and bibasilar infiltrate.  Discussed with SSH-MD and PCCM-MD and bedside RT.  Acute on chronic hypoxic respiratory failure in the setting of strep Pneumonia w/ resultant ARDS superimposed on Chronic neuromuscular hypoventilation. Small rt effusions, some compressive atx Plan:  TC 24-7 at this point. Lasix as BUN/creatinine and BP will allow for  negative fluid volume balance  PRN CXR  Antibiotics have been completed: watch fever and WBC curve.  No decannulation until muscular strength returns.  Hypernatremia-->this has improved, Na is down to 141. Plan Continue free water. Follow BMET. Replace electrolytes as indicated.  Chronic dysphagia  Initially constipated; now w/ diarrhea Plan Has flexi-seal Colase, lactulose and Mirilax have been held; will need to re-assess this Continue tubefeedings   Adrenal insufficiency  Plan  Continue weaning solucortef to off but monitor for signs of adrenal insufficiency closely, patient was on steroids for quite sometime.  Myotonic dystrophy  Delirium Deconditioning  Plan:  RASS goal: 0 Fent gtt, with WUA Risperdal restarted Goal to dc fent drip to intermittent q2h dosing May need methadone if we are hoping to get him of the drip and actively wean him.  Alyson Reedy, M.D. [facility] Pulmonary/Critical Care Medicine. Pager: [phone]. After hours pager: [phone].  03/10/2015 12:39 PM

## 2015-03-12 LAB — CBC
HCT: 36 % — ABNORMAL LOW (ref 39.0–52.0)
HEMOGLOBIN: 11.5 g/dL — AB (ref 13.0–17.0)
MCH: 30.2 pg (ref 26.0–34.0)
MCHC: 31.9 g/dL (ref 30.0–36.0)
MCV: 94.5 fL (ref 78.0–100.0)
Platelets: 258 10*3/uL (ref 150–400)
RBC: 3.81 MIL/uL — AB (ref 4.22–5.81)
RDW: 14.6 % (ref 11.5–15.5)
WBC: 6.2 10*3/uL (ref 4.0–10.5)

## 2015-03-12 LAB — BASIC METABOLIC PANEL
ANION GAP: 8 (ref 5–15)
BUN: 28 mg/dL — ABNORMAL HIGH (ref 6–20)
CO2: 36 mmol/L — ABNORMAL HIGH (ref 22–32)
CREATININE: 0.74 mg/dL (ref 0.61–1.24)
Calcium: 9.4 mg/dL (ref 8.9–10.3)
Chloride: 98 mmol/L — ABNORMAL LOW (ref 101–111)
GFR calc non Af Amer: >60 mL/min (ref >60)
Glucose, Bld: 117 mg/dL — ABNORMAL HIGH (ref 65–99)
POTASSIUM: 3.4 mmol/L — AB (ref 3.5–5.1)
Sodium: 142 mmol/L (ref 135–145)

## 2015-03-13 LAB — BASIC METABOLIC PANEL
ANION GAP: 9 (ref 5–15)
BUN: 28 mg/dL — ABNORMAL HIGH (ref 6–20)
CHLORIDE: 98 mmol/L — AB (ref 101–111)
CO2: 36 mmol/L — AB (ref 22–32)
Calcium: 9.3 mg/dL (ref 8.9–10.3)
Creatinine, Ser: 0.66 mg/dL (ref 0.61–1.24)
GFR calc Af Amer: >60 mL/min (ref >60)
GFR calc non Af Amer: >60 mL/min (ref >60)
Glucose, Bld: 110 mg/dL — ABNORMAL HIGH (ref 65–99)
POTASSIUM: 3.2 mmol/L — AB (ref 3.5–5.1)
SODIUM: 143 mmol/L (ref 135–145)

## 2015-03-13 LAB — PHOSPHORUS: Phosphorus: 4.1 mg/dL (ref 2.5–4.6)

## 2015-03-13 LAB — MAGNESIUM: MAGNESIUM: 2.4 mg/dL (ref 1.7–2.4)

## 2015-03-14 LAB — BASIC METABOLIC PANEL
ANION GAP: 9 (ref 5–15)
BUN: 30 mg/dL — AB (ref 6–20)
CO2: 36 mmol/L — ABNORMAL HIGH (ref 22–32)
Calcium: 9.3 mg/dL (ref 8.9–10.3)
Chloride: 100 mmol/L — ABNORMAL LOW (ref 101–111)
Creatinine, Ser: 0.68 mg/dL (ref 0.61–1.24)
Glucose, Bld: 111 mg/dL — ABNORMAL HIGH (ref 65–99)
POTASSIUM: 3.2 mmol/L — AB (ref 3.5–5.1)
Sodium: 145 mmol/L (ref 135–145)

## 2015-03-15 ENCOUNTER — Other Ambulatory Visit (HOSPITAL_COMMUNITY): Payer: Medicare Other

## 2015-03-15 NOTE — Progress Notes (Signed)
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 He was admitted 6/18 reporting cough with minimal sputum production for 2 days. Normally uses 2 L oxygen at home-he is supposed to be on nocturnal BiPAP for hypoventilation but does not use. He was brought in by EMS WITH SAT 86% , febrile 100.7. He required up to 6 L oxygen nasal cannula in the ED -and then was placed on nonrebreather due to desaturation. He received 4 L of fluid due to hypotension. CXR showed bibasilar airspace disease. He was intubated in the ER. Admitted to the intensive care w/ working dx of acute hypoxic respiratory failure in setting of CAP vs aspiration PNA and resultant severe sepsis. Central access was placed, empiric antibiotics were started as were vasoactive gtts. Significant events included: Pressor dependent until 6/20 ECHO which was obtained on 6/20 and showed EF 55% and estimated PAS of 35 mmHg. CT head obtained 6/21 this was negative and CT chest on 6/21 and this showed bilateral effusions; right > left, compressive atx and R>L infiltrate. Sputum samples grew out strep pneumoniae . Weaning efforts were started but we decided on early trach  to facilitate aggressive weaning and keeping in mind underlying neuro-muscular disease. He underwent trach on 6/24 and weaning trials were continued. His antibiotics were completed on 6/25. As of 6/26 he is making slow but persistent progress. He is now ready for d/c to LTAC setting where he can continue ventilator weaning and rehabilitative services.    SUBJECTIVE:  Restless  VITAL SIGNS: 134/76 98 22  96% 98.9  PHYSICAL EXAMINATION: General: Chronically ill , lying in bed Neuro: Rass-1 difficult to arouse, no follws commands HEENT: JVD, trach clean Cardiovascular: S1 and S2 RRR Lungs: Reduced, clear Abdomen: Soft nontender , PEG site clean  Musculoskeletal: Wasting, foot drop Skin: no rash   Recent Labs Lab 03/12/15 0800 03/13/15 0612 03/14/15 0643  NA 142 143 145  K 3.4* 3.2* 3.2*  CL 98* 98* 100*  CO2 36* 36* 36*  BUN 28* 28* 30*  CREATININE 0.74 0.66 0.68  GLUCOSE 117* 110* 111*    Recent Labs Lab 03/12/15 0800  HGB 11.5*  HCT 36.0*  WBC 6.2  PLT 258   No results found.  ASSESSMENT / PLAN:     Acute on chronic hypoxic respiratory failure in the setting of strep Pneumonia w/ resultant ARDS superimposed on Chronic neuromuscular hypoventilation. Small rt effusions, some compressive atx Plan:  TC 24x7 at this point. Lasix as BUN/creatinine and BP will allow for negative fluid volume balance  PRN CXR  Antibiotics have been completed: watch fever and WBC curve.  No decannulation until muscular strength returns. PCCM will sign off for now. Please call us if /  when it is time to consider decannulation.    Carl Galvan ACNP Carl Galvan PCCM Pager [phone] till 3 pm If no answer page [phone] 03/15/2015, 9:08 AM  Carl Pupa, MD, PhD 03/15/2015, 10:18 AM Carl Galvan [facility] [phone] or if no answer [phone]

## 2015-03-18 LAB — POTASSIUM: Potassium: 4 mmol/L (ref 3.5–5.1)

## 2016-04-16 IMAGING — CT CT HEAD W/O CM
1 series · 16 of 30 positions shown, 20 images · non-contrast
Comparison: 02/27/2014

CLINICAL DATA: Not waking up from anesthesia

EXAM:
CT HEAD WITHOUT CONTRAST
TECHNIQUE: Contiguous axial images were obtained from the base of the skull
through the vertex without intravenous contrast.

[Series 2: head 5.0 h30s · axial · 0.45mm/px · z∈[+192,+327]mm · 16 of 30 slices shown, 20 images]
[im 2/30  brain]
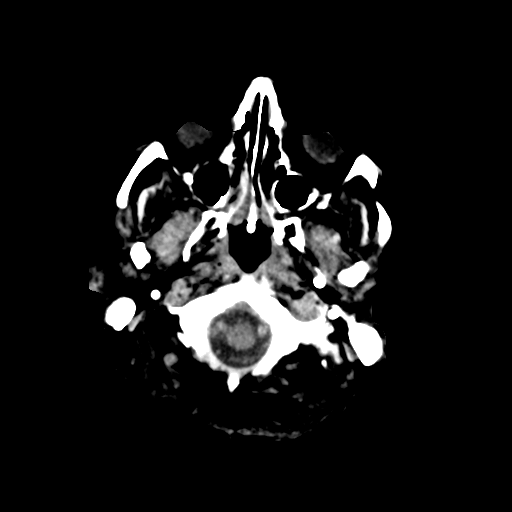
[im 2/30  bone]
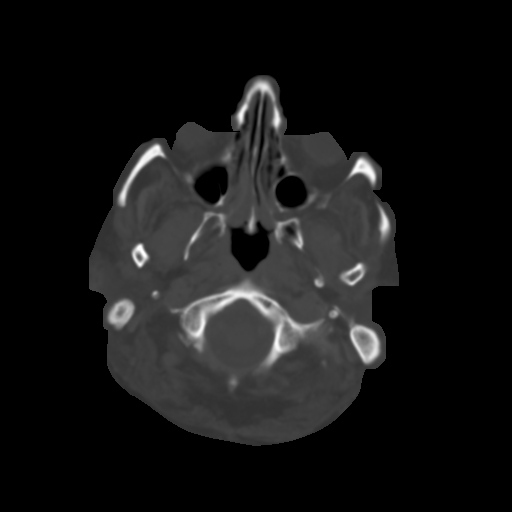
[im 4/30  brain]
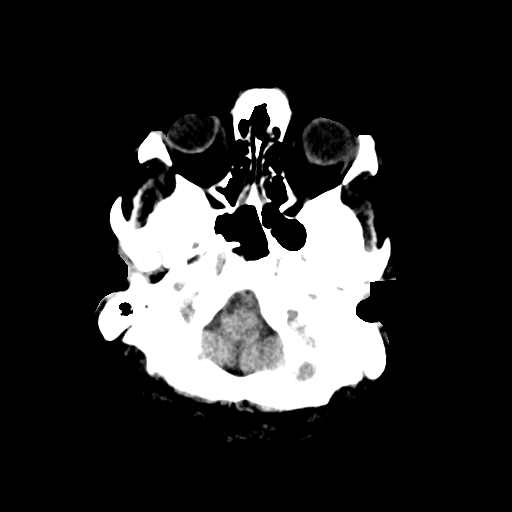
[im 6/30  brain]
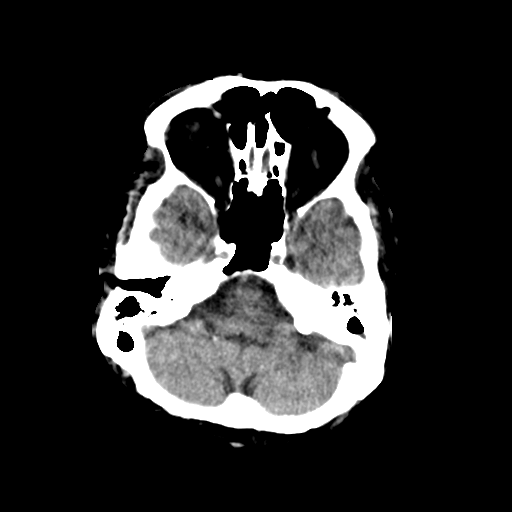
[im 8/30  brain]
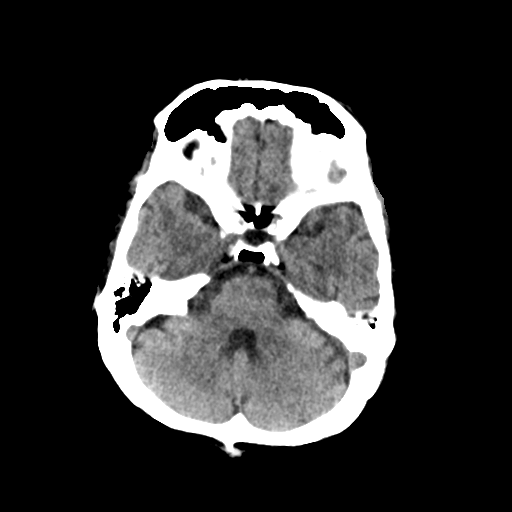
[im 9/30  brain]
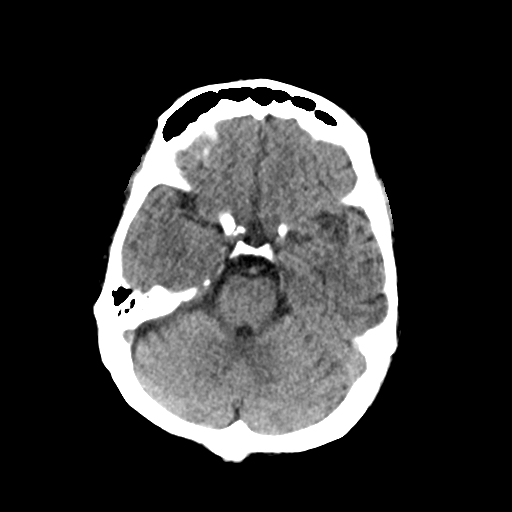
[im 9/30  bone]
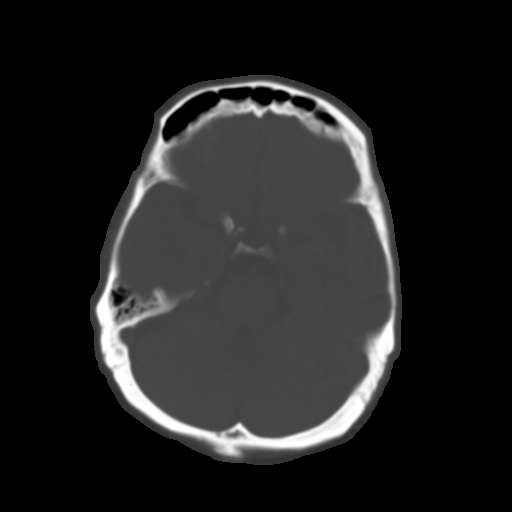
[im 11/30  brain]
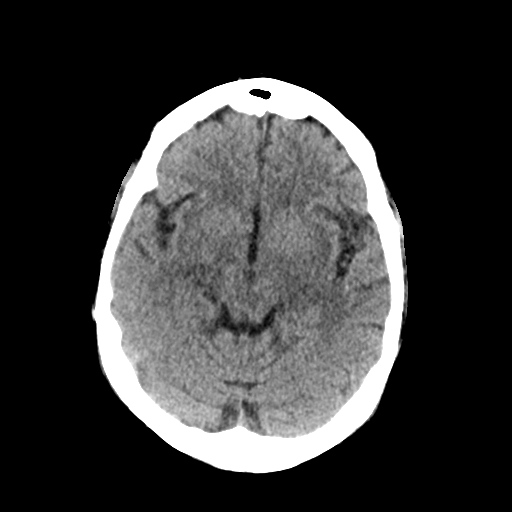
[im 13/30  brain]
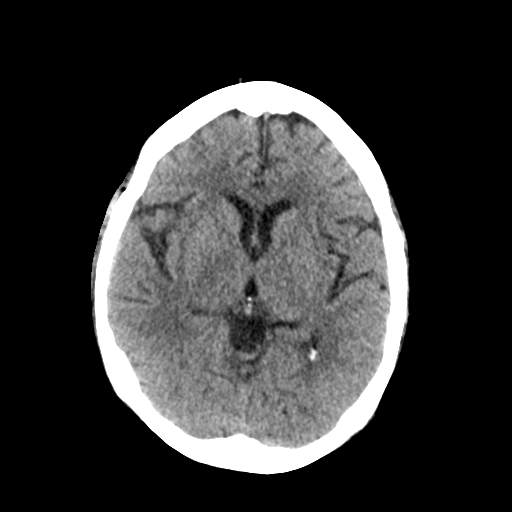
[im 15/30  brain]
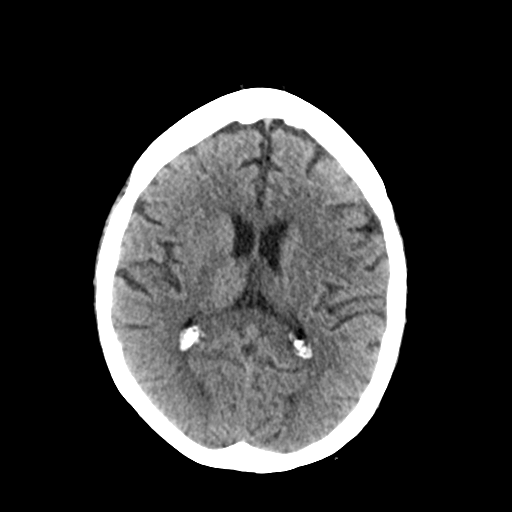
[im 16/30  brain]
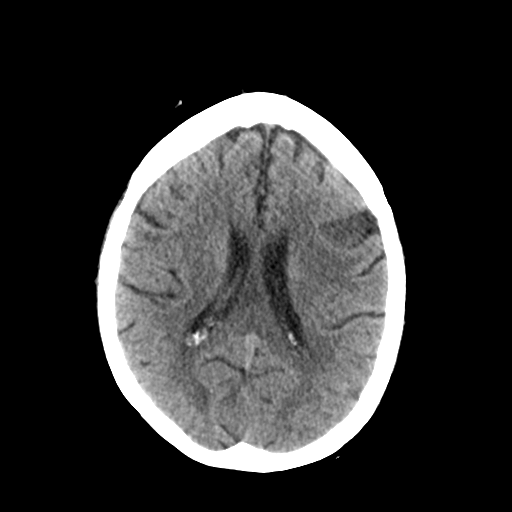
[im 16/30  bone]
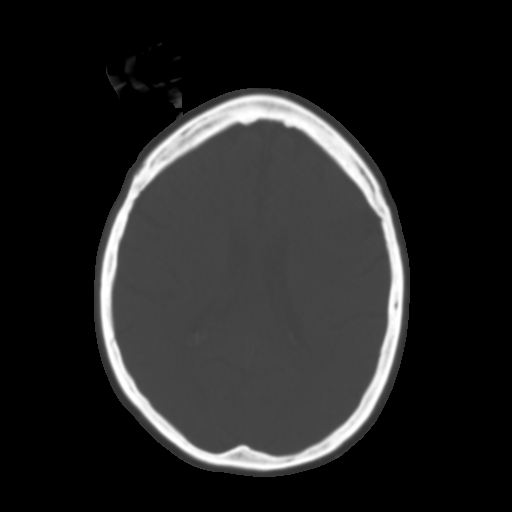
[im 18/30  brain]
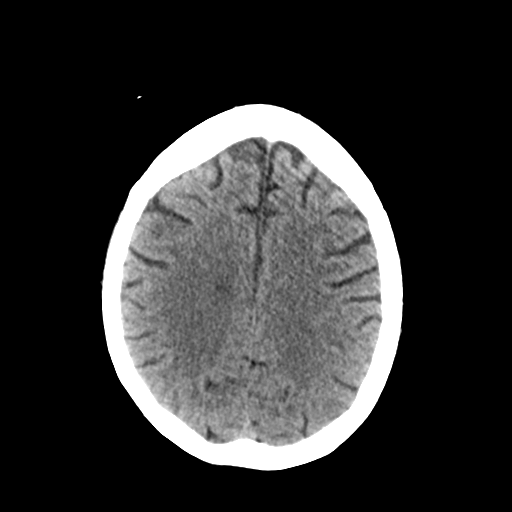
[im 20/30  brain]
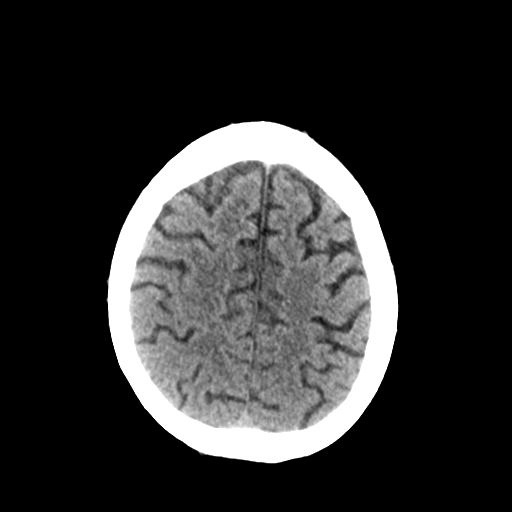
[im 22/30  brain]
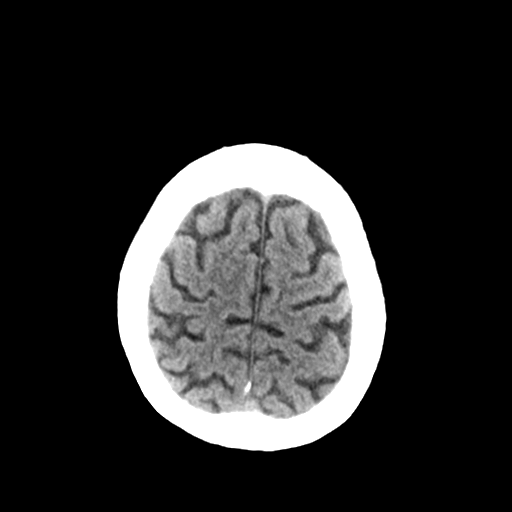
[im 23/30  brain]
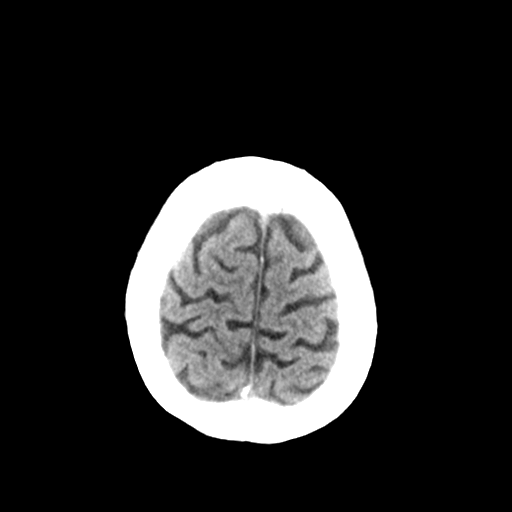
[im 23/30  bone]
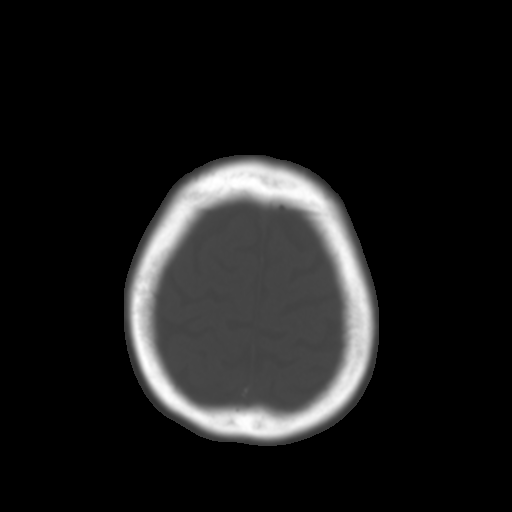
[im 25/30  brain]
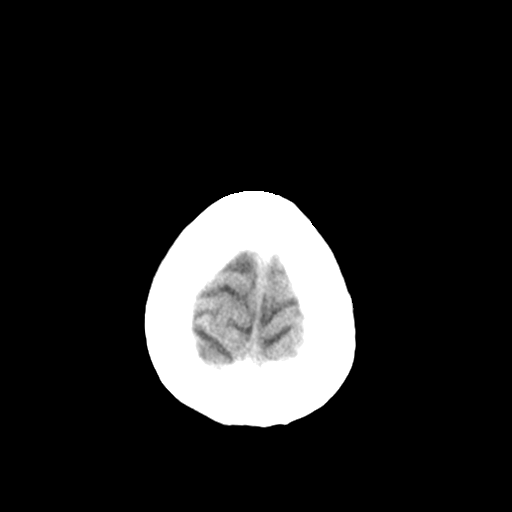
[im 27/30  brain]
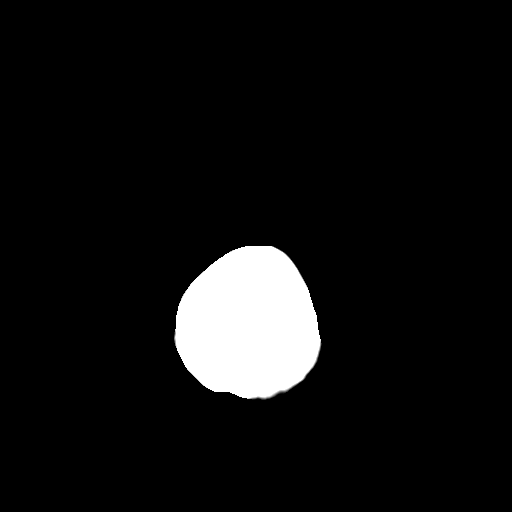
[im 29/30  brain]
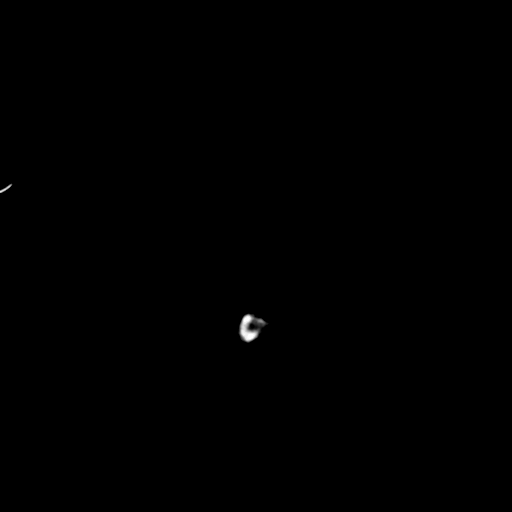

[16 of 30 positions shown; findings below may reference images not displayed]

FINDINGS: No acute cortical infarct, hemorrhage, or mass lesion ispresent.
Ventricles are of normal size. No significant extra-axial fluid
collection is present. The paranasal sinuses are clear. There is
partial opacification of the mastoid air cells. The osseous skull is
intact. The calvarium appears intact.
IMPRESSION: 1. No acute intracranial abnormalities.
2. Bilateral mastoid air cell opacification.

## 2016-04-17 IMAGING — CR DG CHEST 1V PORT
1 series · 1 of 1 positions shown · non-contrast
Comparison: 02/15/2015.  02/14/2015.  CT 02/15/2015 .

CLINICAL DATA: Pneumonia.

EXAM:
PORTABLE CHEST - 1 VIEW

[AP]
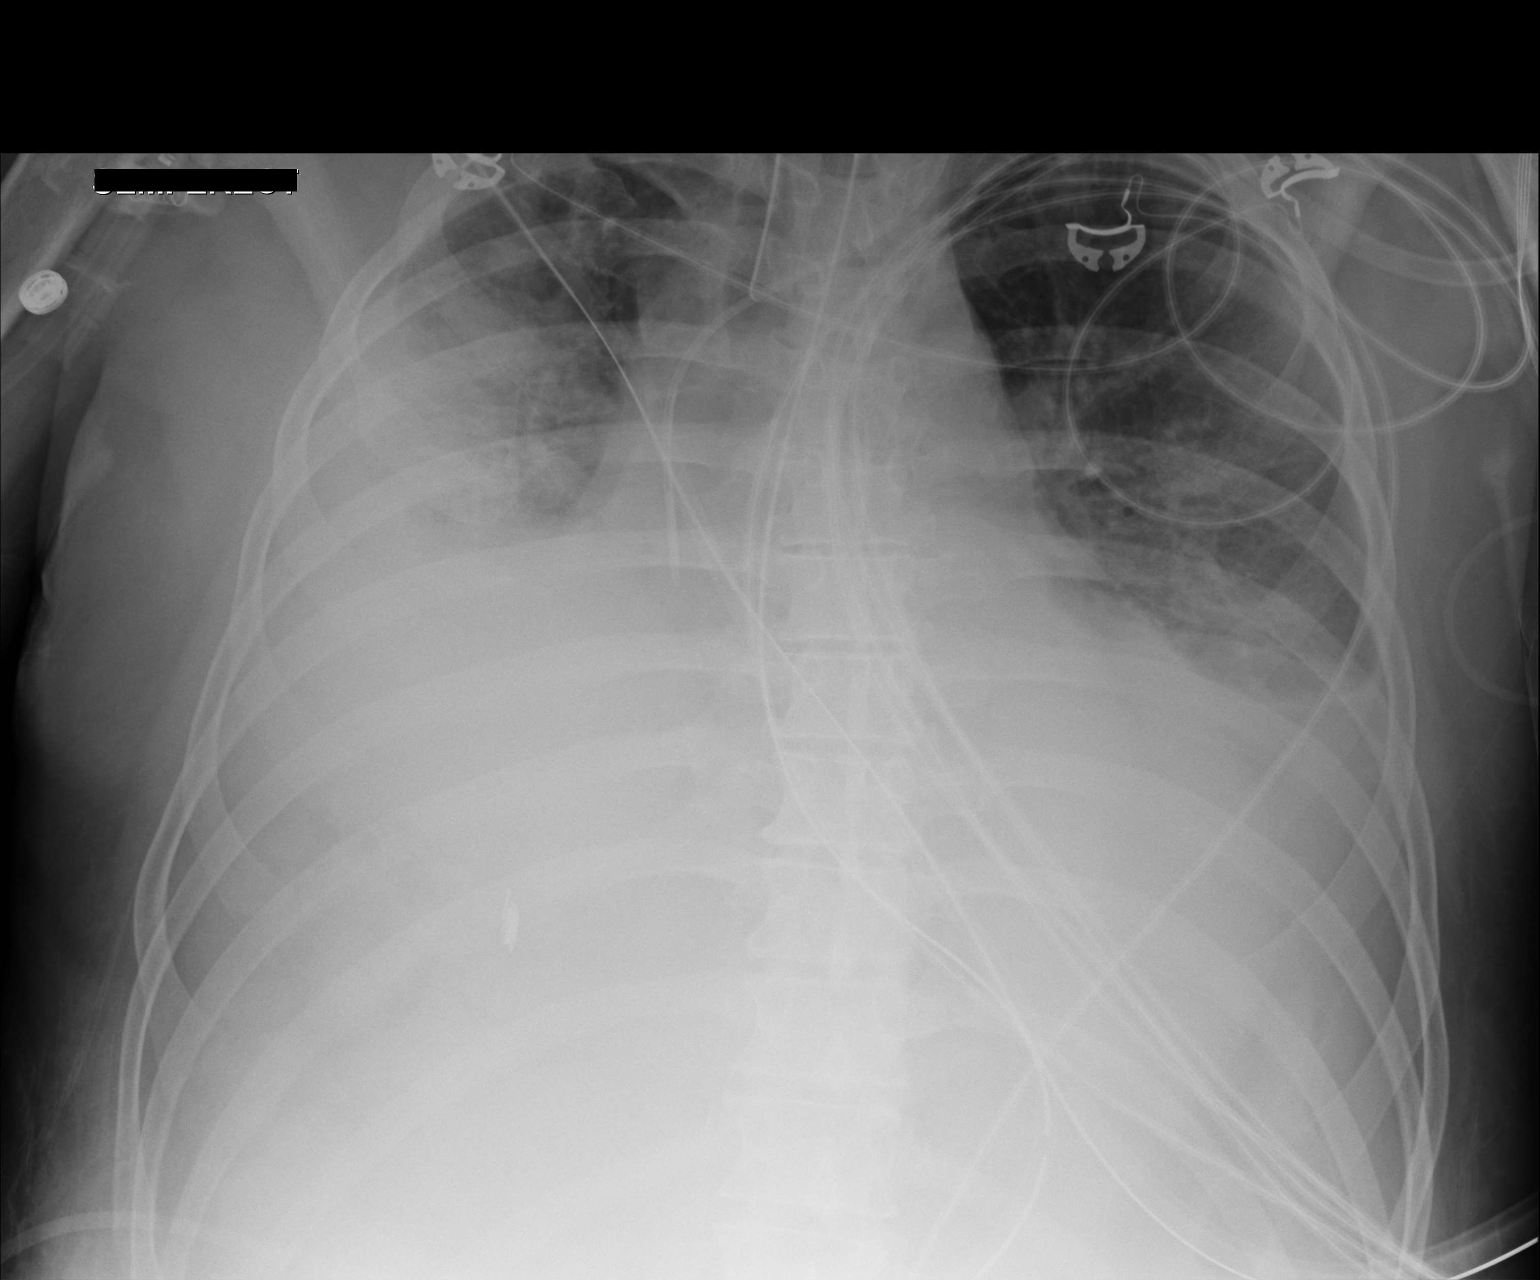

[1 of 1 positions shown; findings below may reference images not displayed]

FINDINGS: Endotracheal tube, NG tube, left IJ line stable position. Heart size
stable. Persistent unchanged bilateral pulmonary infiltrates and/or
edema. Persistent pleural effusions, right side greater than left.
No pneumothorax. Cholecystectomy.
IMPRESSION: 1. Lines and tubes in stable position.
2. Persistent unchanged prominent bilateral pulmonary infiltrates.
Persistent bilateral pleural effusions right side greater than left.
No interim improvement.

## 2016-04-18 IMAGING — CR DG CHEST 1V PORT
1 series · 1 of 1 positions shown · non-contrast
Comparison: February 16, 2015

CLINICAL DATA: Hypoxia

EXAM:
PORTABLE CHEST - 1 VIEW

[AP]
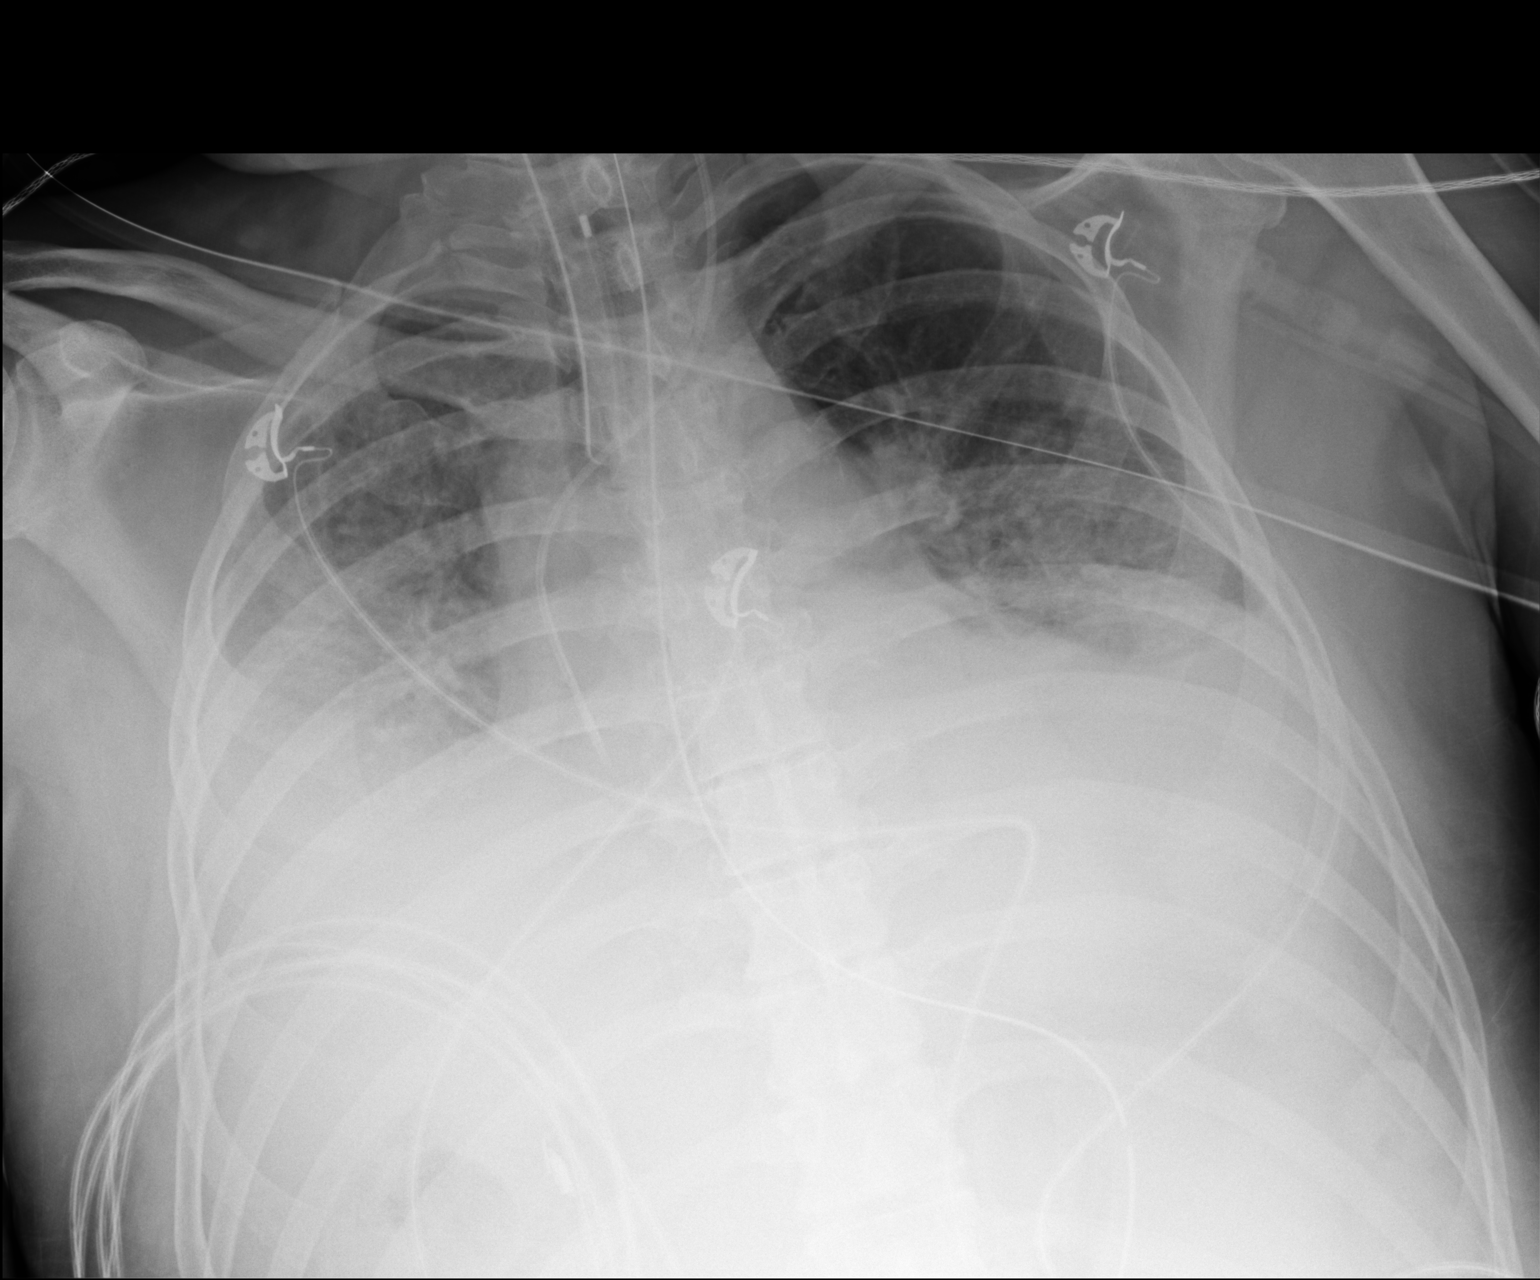

[1 of 1 positions shown; findings below may reference images not displayed]

FINDINGS: Endotracheal tube tip is 2.9 cm above the carina. Central catheter
tip is at the cavoatrial junction. Nasogastric tube tip and side
port are in the stomach. No pneumothorax. There is bibasilar
airspace consolidation with bilateral effusions, right larger than
left. There is interstitial edema in the mid and lower lung zones as
well, stable. There is no new opacity. Heart is upper normal in size
with pulmonary vascular within normal limits. No adenopathy.
IMPRESSION: Tube and catheter positions as described without pneumothorax.
Bilateral effusions and airspace consolidation. Interstitial edema
is stable. Suspect a degree of congestive heart failure. There may
well be superimposed pneumonia in the lower lobes. No new opacity
appreciable.

## 2016-04-20 IMAGING — CR DG CHEST 1V PORT
1 series · 1 of 1 positions shown · non-contrast
Comparison: 02/18/2015

CLINICAL DATA: Pneumonia.

EXAM:
PORTABLE CHEST - 1 VIEW

[AP]
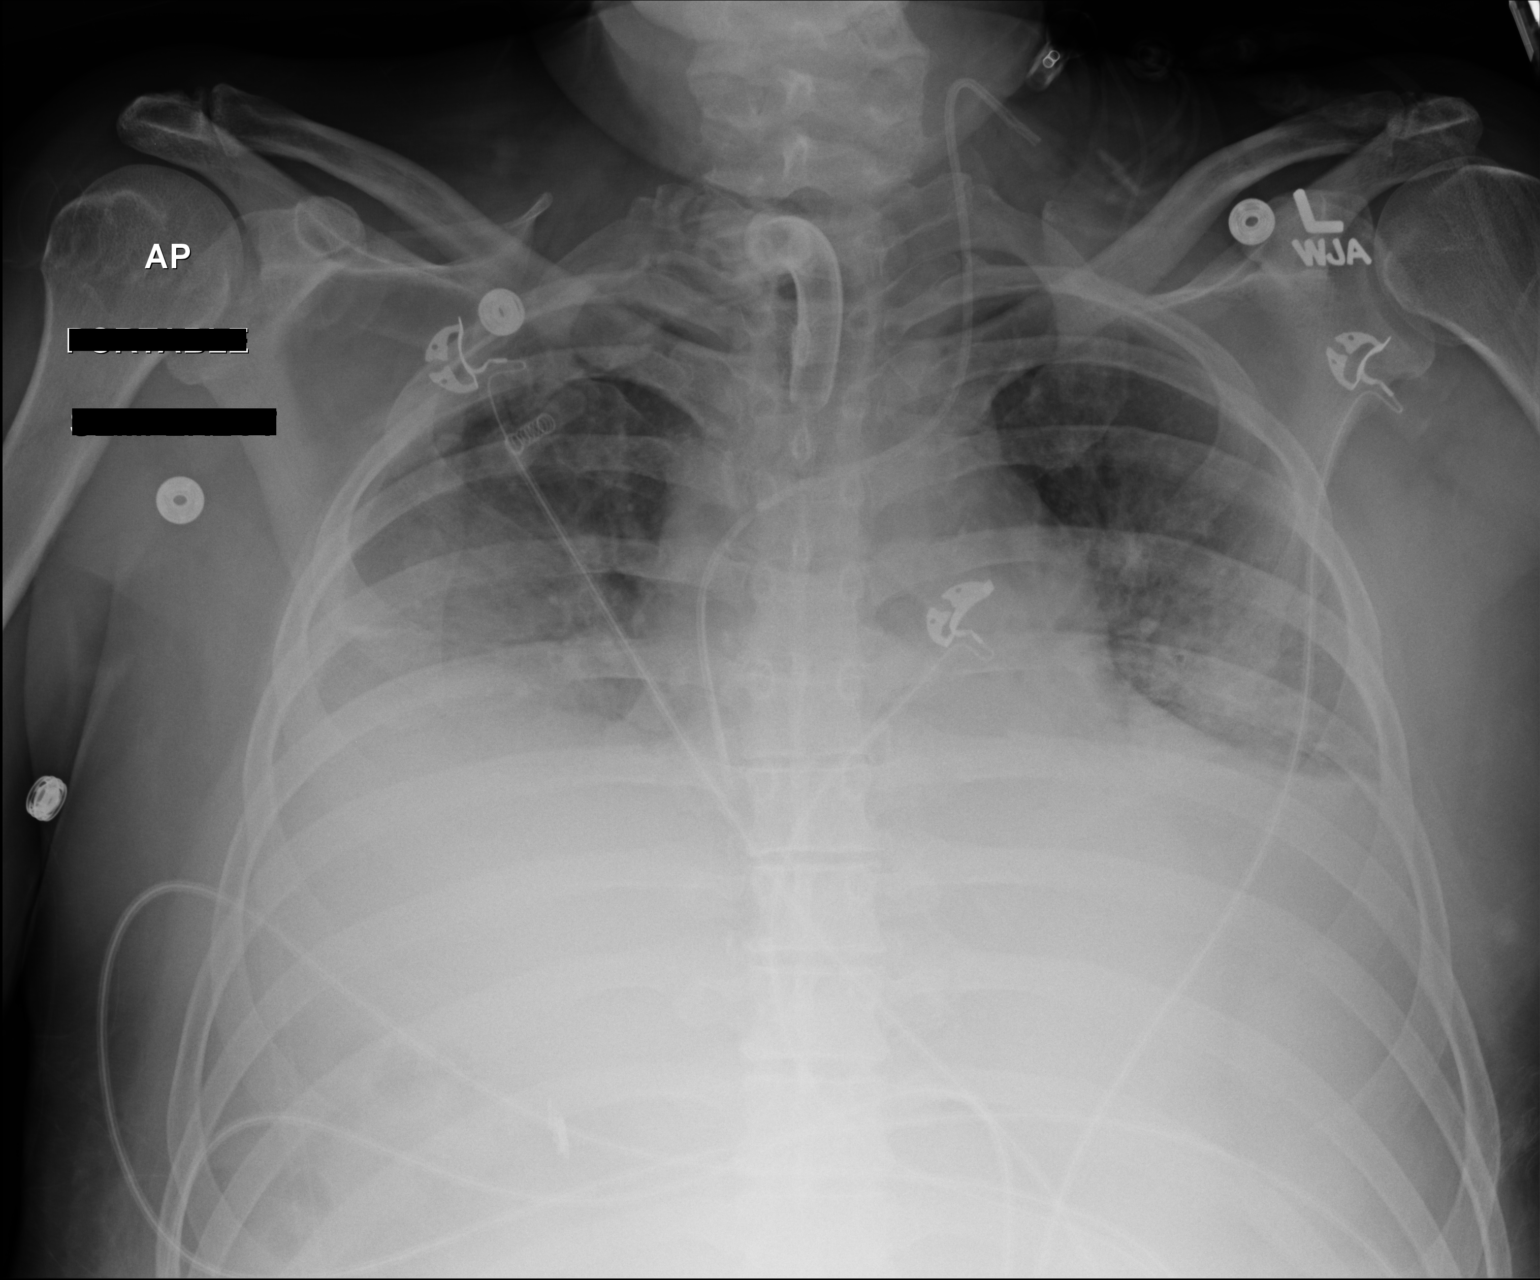

[1 of 1 positions shown; findings below may reference images not displayed]

FINDINGS: Tracheostomy tube in adequate position and unchanged. Left IJ
central venous catheter has tip over the right atrium just below the
cavoatrial junction.

Lungs are moderately hypoinflated with opacification over the mid to
lower lungs with slight worsening in the left midlung. Findings are
likely due to small bilateral pleural effusions with associated
atelectasis. Cannot exclude infection. Remainder of the exam is
unchanged.
IMPRESSION: Moderate hypoinflation with opacification over the mid to lower
lungs bilaterally with slight worsening over the left midlung.
Findings are likely due to the effusions with atelectasis, although
cannot exclude infection.

Tubes and lines as described. Note the left IJ central venous
catheter has tip over the right atrium.

## 2016-04-21 IMAGING — CR DG ABD PORTABLE 1V
1 series · 1 of 1 positions shown · non-contrast
Comparison: None.

CLINICAL DATA: PEG adjustment, replacement.

EXAM:
PORTABLE ABDOMEN - 1 VIEW

[AP]
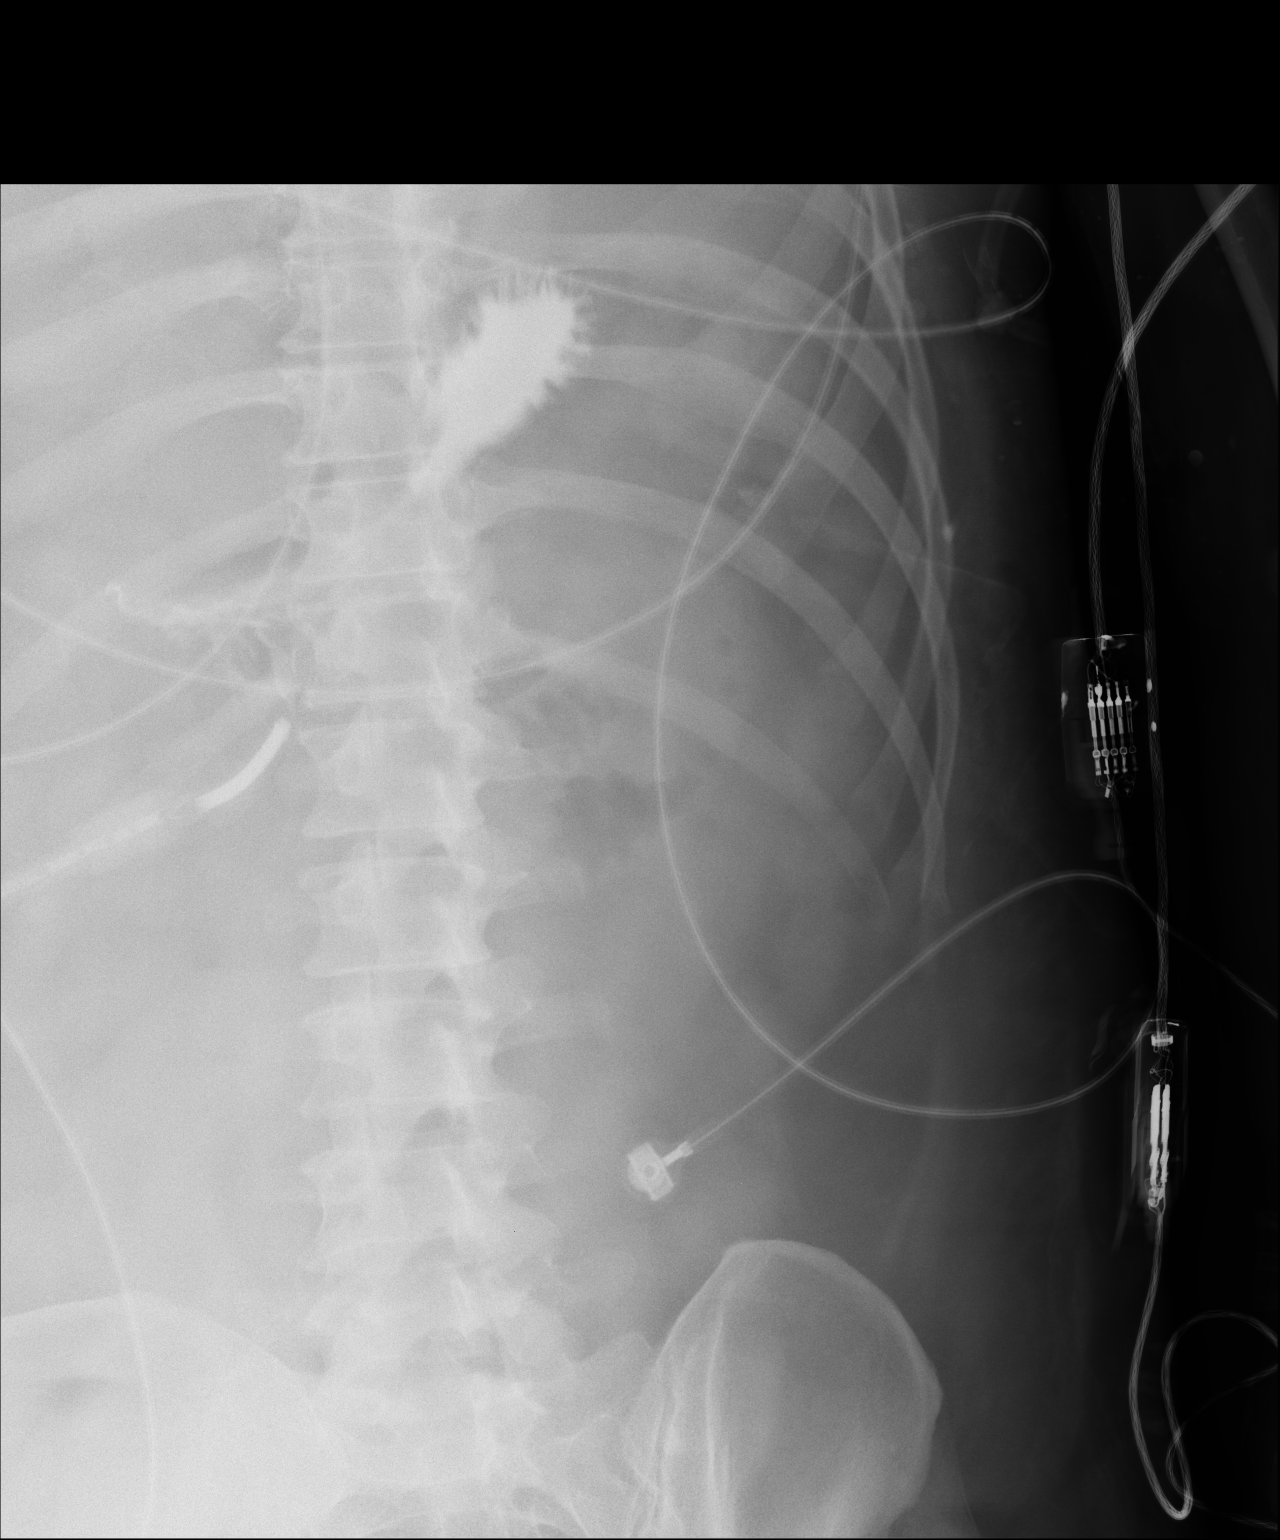

[1 of 1 positions shown; findings below may reference images not displayed]

FINDINGS: Twenty mL enteric contrast administered through indwelling
gastrostomy tube prior to the exam. There is contrast opacifying the
stomach. No evidence of extravasation or leak. There is a normal
bowel gas pattern.
IMPRESSION: Contrast opacifying the stomach without evidence of extravasation or
leak.

## 2018-06-27 ENCOUNTER — Inpatient Hospital Stay
Admission: EM | Admit: 2018-06-27 | Discharge: 2018-07-23 | Disposition: A | Discharge status: Skilled Nursing Facility | Payer: Medicare Other | Source: Other Acute Inpatient Hospital | Attending: Internal Medicine | Admitting: Internal Medicine

## 2018-06-27 ENCOUNTER — Other Ambulatory Visit (HOSPITAL_COMMUNITY): Payer: Medicare Other

## 2018-06-27 DIAGNOSIS — J9610 Chronic respiratory failure, unspecified whether with hypoxia or hypercapnia: Secondary | ICD-10-CM

## 2018-06-27 DIAGNOSIS — Z930 Tracheostomy status: Secondary | ICD-10-CM

## 2018-06-27 DIAGNOSIS — G7100 Muscular dystrophy, unspecified: Secondary | ICD-10-CM | POA: Diagnosis present

## 2018-06-27 DIAGNOSIS — J9621 Acute and chronic respiratory failure with hypoxia: Secondary | ICD-10-CM | POA: Diagnosis present

## 2018-06-27 DIAGNOSIS — J189 Pneumonia, unspecified organism: Secondary | ICD-10-CM | POA: Diagnosis present

## 2018-06-27 DIAGNOSIS — Z931 Gastrostomy status: Secondary | ICD-10-CM

## 2018-06-27 HISTORY — DX: Pneumonia, unspecified organism: J18.9

## 2018-06-27 HISTORY — DX: Acute and chronic respiratory failure with hypoxia: J96.21

## 2018-06-27 HISTORY — DX: Tracheostomy status: Z93.0

## 2018-06-27 LAB — BLOOD GAS, ARTERIAL
Acid-Base Excess: 8.4 mmol/L — ABNORMAL HIGH (ref 0.0–2.0)
Bicarbonate: 33.5 mmol/L — ABNORMAL HIGH (ref 20.0–28.0)
FIO2: 0.4
O2 SAT: 93.9 %
PCO2 ART: 57 mmHg — AB (ref 32.0–48.0)
Patient temperature: 98.6
pH, Arterial: 7.387 (ref 7.350–7.450)
pO2, Arterial: 70.7 mmHg — ABNORMAL LOW (ref 83.0–108.0)

## 2018-06-27 MED ORDER — GABAPENTIN 300 MG PO CAPS
300.00 | ORAL_CAPSULE | ORAL | Status: DC
Start: 2018-06-28 — End: 2018-06-27

## 2018-06-27 MED ORDER — BISACODYL 5 MG PO TBEC
10.00 | DELAYED_RELEASE_TABLET | ORAL | Status: DC
Start: ? — End: 2018-06-27

## 2018-06-27 MED ORDER — GUAIFENESIN-DM 100-10 MG/5ML PO SYRP
5.00 | ORAL_SOLUTION | ORAL | Status: DC
Start: ? — End: 2018-06-27

## 2018-06-27 MED ORDER — IOHEXOL 350 MG/ML SOLN
100.00 | INTRAVENOUS | Status: DC
Start: ? — End: 2018-06-27

## 2018-06-27 MED ORDER — ALBUTEROL SULFATE (2.5 MG/3ML) 0.083% IN NEBU
2.50 | INHALATION_SOLUTION | RESPIRATORY_TRACT | Status: DC
Start: 2018-06-28 — End: 2018-06-27

## 2018-06-27 MED ORDER — ONDANSETRON 4 MG PO TBDP
4.00 | ORAL_TABLET | ORAL | Status: DC
Start: ? — End: 2018-06-27

## 2018-06-27 MED ORDER — SORBITOL 70 % RE SOLN
30.00 | RECTAL | Status: DC
Start: ? — End: 2018-06-27

## 2018-06-27 MED ORDER — MIDODRINE HCL 5 MG PO TABS
10.00 | ORAL_TABLET | ORAL | Status: DC
Start: 2018-06-28 — End: 2018-06-27

## 2018-06-27 MED ORDER — HYDRALAZINE HCL 20 MG/ML IJ SOLN
10.00 | INTRAMUSCULAR | Status: DC
Start: ? — End: 2018-06-27

## 2018-06-27 MED ORDER — GUAIFENESIN 100 MG/5ML PO SYRP
400.00 | ORAL_SOLUTION | ORAL | Status: DC
Start: ? — End: 2018-06-27

## 2018-06-27 MED ORDER — FAMOTIDINE 20 MG PO TABS
40.00 | ORAL_TABLET | ORAL | Status: DC
Start: 2018-06-28 — End: 2018-06-27

## 2018-06-27 MED ORDER — SERTRALINE HCL 100 MG PO TABS
100.00 | ORAL_TABLET | ORAL | Status: DC
Start: 2018-06-28 — End: 2018-06-27

## 2018-06-27 MED ORDER — GENERIC EXTERNAL MEDICATION
4.50 | Status: DC
Start: 2018-06-28 — End: 2018-06-27

## 2018-06-27 MED ORDER — IOPAMIDOL (ISOVUE-300) INJECTION 61%: 50.0000 mL | Freq: Once | INTRAVENOUS | Status: DC | PRN

## 2018-06-27 MED ORDER — TRAMADOL HCL 50 MG PO TABS
50.00 | ORAL_TABLET | ORAL | Status: DC
Start: ? — End: 2018-06-27

## 2018-06-27 MED ORDER — METHYLPHENIDATE HCL 10 MG PO TABS
20.00 | ORAL_TABLET | ORAL | Status: DC
Start: 2018-06-28 — End: 2018-06-27

## 2018-06-27 MED ORDER — ONDANSETRON HCL 4 MG/2ML IJ SOLN
4.00 | INTRAMUSCULAR | Status: DC
Start: ? — End: 2018-06-27

## 2018-06-27 MED ORDER — ALUMINUM-MAGNESIUM-SIMETHICONE 200-200-20 MG/5ML PO SUSP
30.00 | ORAL | Status: DC
Start: ? — End: 2018-06-27

## 2018-06-27 MED ORDER — SCOPOLAMINE 1 MG/3DAYS TD PT72
1.00 | MEDICATED_PATCH | TRANSDERMAL | Status: DC
Start: 2018-06-29 — End: 2018-06-27

## 2018-06-27 MED ORDER — ALPRAZOLAM 0.25 MG PO TABS
.25 | ORAL_TABLET | ORAL | Status: DC
Start: ? — End: 2018-06-27

## 2018-06-27 MED ORDER — ACETAMINOPHEN 650 MG/20.3ML PO SOLN
650.00 | ORAL | Status: DC
Start: ? — End: 2018-06-27

## 2018-06-27 MED ORDER — ACETAMINOPHEN 650 MG RE SUPP
650.00 | RECTAL | Status: DC
Start: ? — End: 2018-06-27

## 2018-06-27 MED ORDER — ALBUTEROL SULFATE (2.5 MG/3ML) 0.083% IN NEBU
2.50 | INHALATION_SOLUTION | RESPIRATORY_TRACT | Status: DC
Start: ? — End: 2018-06-27

## 2018-06-27 MED ORDER — MELATONIN 3 MG PO TABS
6.00 | ORAL_TABLET | ORAL | Status: DC
Start: 2018-06-27 — End: 2018-06-27

## 2018-06-27 MED ORDER — GENERIC EXTERNAL MEDICATION
Status: DC
Start: ? — End: 2018-06-27

## 2018-06-27 MED ORDER — IOPAMIDOL (ISOVUE-300) INJECTION 61%
INTRAVENOUS | Status: AC
  Administered 2018-06-27: 50 mL via GASTROSTOMY
  Filled 2018-06-27: qty 50

## 2018-06-27 MED ORDER — GENERIC EXTERNAL MEDICATION
1.00 | Status: DC
Start: ? — End: 2018-06-27

## 2018-06-27 MED ORDER — HEPARIN SODIUM (PORCINE) 5000 UNIT/ML IJ SOLN
5000.00 | INTRAMUSCULAR | Status: DC
Start: 2018-06-27 — End: 2018-06-27

## 2018-06-27 MED ORDER — CLONIDINE HCL 0.1 MG PO TABS
.10 | ORAL_TABLET | ORAL | Status: DC
Start: ? — End: 2018-06-27

## 2018-06-28 LAB — CBC WITH DIFFERENTIAL/PLATELET
ABS IMMATURE GRANULOCYTES: 0.06 10*3/uL (ref 0.00–0.07)
BASOS PCT: 1 %
Basophils Absolute: 0 10*3/uL (ref 0.0–0.1)
EOS ABS: 0.4 10*3/uL (ref 0.0–0.5)
Eosinophils Relative: 6 %
HCT: 38.2 % — ABNORMAL LOW (ref 39.0–52.0)
HEMOGLOBIN: 11.5 g/dL — AB (ref 13.0–17.0)
Immature Granulocytes: 1 %
Lymphocytes Relative: 18 %
Lymphs Abs: 1.1 10*3/uL (ref 0.7–4.0)
MCH: 29.7 pg (ref 26.0–34.0)
MCHC: 30.1 g/dL (ref 30.0–36.0)
MCV: 98.7 fL (ref 80.0–100.0)
MONO ABS: 0.4 10*3/uL (ref 0.1–1.0)
MONOS PCT: 7 %
NEUTROS ABS: 4 10*3/uL (ref 1.7–7.7)
Neutrophils Relative %: 67 %
Platelets: 359 10*3/uL (ref 150–400)
RBC: 3.87 MIL/uL — AB (ref 4.22–5.81)
RDW: 13.5 % (ref 11.5–15.5)
WBC: 6 10*3/uL (ref 4.0–10.5)
nRBC: 0 % (ref 0.0–0.2)

## 2018-06-28 LAB — COMPREHENSIVE METABOLIC PANEL
ALK PHOS: 160 U/L — AB (ref 38–126)
ALT: 58 U/L — ABNORMAL HIGH (ref 0–44)
ANION GAP: 5 (ref 5–15)
AST: 60 U/L — ABNORMAL HIGH (ref 15–41)
Albumin: 2.3 g/dL — ABNORMAL LOW (ref 3.5–5.0)
BILIRUBIN TOTAL: 0.4 mg/dL (ref 0.3–1.2)
BUN: 16 mg/dL (ref 8–23)
CALCIUM: 9.4 mg/dL (ref 8.9–10.3)
CO2: 32 mmol/L (ref 22–32)
Chloride: 103 mmol/L (ref 98–111)
Creatinine, Ser: 0.76 mg/dL (ref 0.61–1.24)
GFR calc Af Amer: >60 mL/min (ref >60)
GFR calc non Af Amer: >60 mL/min (ref >60)
Glucose, Bld: 114 mg/dL — ABNORMAL HIGH (ref 70–99)
POTASSIUM: 4.3 mmol/L (ref 3.5–5.1)
Sodium: 140 mmol/L (ref 135–145)
TOTAL PROTEIN: 7 g/dL (ref 6.5–8.1)

## 2018-06-28 LAB — URINALYSIS, ROUTINE W REFLEX MICROSCOPIC
BILIRUBIN URINE: NEGATIVE
Glucose, UA: NEGATIVE mg/dL
KETONES UR: NEGATIVE mg/dL
NITRITE: NEGATIVE
PROTEIN: 30 mg/dL — AB
RBC / HPF: >50 RBC/hpf — ABNORMAL HIGH (ref 0–5)
SPECIFIC GRAVITY, URINE: 1.019 (ref 1.005–1.030)
pH: 8 (ref 5.0–8.0)

## 2018-06-28 LAB — HEMOGLOBIN A1C
Hgb A1c MFr Bld: 5.4 % (ref 4.8–5.6)
Mean Plasma Glucose: 108.28 mg/dL

## 2018-06-28 LAB — MAGNESIUM: MAGNESIUM: 2.5 mg/dL — AB (ref 1.7–2.4)

## 2018-06-28 LAB — PROTIME-INR
INR: 1.14
PROTHROMBIN TIME: 14.5 s (ref 11.4–15.2)

## 2018-06-28 LAB — TSH: TSH: 3.049 u[IU]/mL (ref 0.350–4.500)

## 2018-06-28 LAB — PHOSPHORUS: Phosphorus: 3.7 mg/dL (ref 2.5–4.6)

## 2018-06-29 ENCOUNTER — Encounter: Payer: Self-pay | Admitting: Internal Medicine

## 2018-06-29 DIAGNOSIS — G7100 Muscular dystrophy, unspecified: Secondary | ICD-10-CM | POA: Diagnosis not present

## 2018-06-29 DIAGNOSIS — J9621 Acute and chronic respiratory failure with hypoxia: Secondary | ICD-10-CM | POA: Diagnosis not present

## 2018-06-29 DIAGNOSIS — J189 Pneumonia, unspecified organism: Secondary | ICD-10-CM | POA: Diagnosis not present

## 2018-06-29 DIAGNOSIS — Z930 Tracheostomy status: Secondary | ICD-10-CM

## 2018-06-29 LAB — URINE CULTURE: Culture: NO GROWTH

## 2018-06-29 NOTE — Consult Note (Signed)
Pulmonary Critical Care Medicine [facility] [place]  PULMONARY SERVICE  Date of Service: 06/29/2018  PULMONARY CRITICAL CARE CONSULT   Carl Galvan  ZOX:[id number]  DOB: 09-09-1955   DOA: 06/27/2018  Referring Physician: Carron Curie, MD  HPI: Carl Galvan is a 62 y.o. male seen for follow up of Acute on Chronic Respiratory Failure.  Patient has multiple medical problems presented to the original facility with shortness of breath.  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  The patient is also supposed to be on oxygen at home is noncompliant.  Patient presented to the hospital with increasing shortness of breath was diagnosed with pneumonia treated with Augmentin and Flagyl.  There was no improvement so the patient came in and was admitted.  The patient had decompensation apparently was agitated and was placed on Precedex.  In addition for secretions patient required aggressive pulmonary toilet and Mucomyst.  Patient's cultures were sent he was diagnosed with healthcare associated pneumonia was treated for multiple organisms including Klebsiella Morganella.  Patient was started apparently on Zosyn.  He was placed on the ventilator after his tracheostomy was changed over to a cuffed tracheostomy.  Review of Systems:  ROS performed and is unremarkable other than noted above.  Medical History: Past Medical History:  Diagnosis Date  . Myotonic dystrophy (HCC), sleep apnea  . Narcolepsy   Surgical History Past Surgical History:  Procedure Laterality Date  . PEG TUBE PLACEMENT  . TRACHEOSTOMY    Allergies: Patient has no known allergies.  Social History: Social History   Socioeconomic History  . Marital status: Single  Spouse name: Not on file  . Number of children: Not on file  . Years of education: Not on file  . Highest education level: Not on file  Occupational History  . Not on file  Social Needs   . Financial resource strain: Not on file  . Food insecurity:  Worry: Not on file  Inability: Not on file  . Transportation needs:  Medical: Not on file  Non-medical: Not on file  Tobacco Use  . Smoking status: Never Smoker  . Smokeless tobacco: Never Used  Substance and Sexual Activity  . Alcohol use: No  . Drug use: No   Medications: Reviewed on Rounds  Physical Exam:  Vitals: Temperature 97.9 pulse 58 respiratory 18 blood pressure 9459 saturations 98%  Ventilator Settings off the ventilator on T collar FiO2 28%  . General: Comfortable at this time . Eyes: Grossly normal lids, irises & conjunctiva . ENT: grossly tongue is normal . Neck: no obvious mass . Cardiovascular: S1-S2 normal no gallop or rub is noted at this time . Respiratory: No rhonchi no rales are noted . Abdomen: Soft and nontender . Skin: no rash seen on limited exam . Musculoskeletal: not rigid . Psychiatric:unable to assess . Neurologic: no seizure no involuntary movements         Labs on Admission:  Basic Metabolic Panel: Recent Labs  Lab 06/28/18 0552  NA 140  K 4.3  CL 103  CO2 32  GLUCOSE 114*  BUN 16  CREATININE 0.76  CALCIUM 9.4  MG 2.5*  PHOS 3.7    Recent Labs  Lab 06/27/18 2300  PHART 7.387  PCO2ART 57.0*  PO2ART 70.7*  HCO3 33.5*  O2SAT 93.9    Liver Function Tests: Recent Labs  Lab 06/28/18 0552  AST 60*  ALT 58*  ALKPHOS 160*  BILITOT 0.4  PROT 7.0  ALBUMIN 2.3*   No results for input(s): LIPASE, AMYLASE in the last 168 hours. No results for input(s): AMMONIA in the last 168 hours.  CBC: Recent Labs  Lab 06/28/18 0552  WBC 6.0  NEUTROABS 4.0  HGB 11.5*  HCT 38.2*  MCV 98.7  PLT 359    Cardiac Enzymes: No results for input(s): CKTOTAL, CKMB, CKMBINDEX, TROPONINI in the last 168 hours.  BNP (last 3 results) No results for input(s): BNP in the last 8760 hours.  ProBNP (last 3 results) No results for input(s): PROBNP in the last 8760  hours.   Radiological Exams on Admission: Dg Abdomen Peg Tube Location  Result Date: 06/27/2018 CLINICAL DATA:  Peg tube placement. EXAM: ABDOMEN - 1 VIEW COMPARISON:  Abdominal radiograph March 05, 2018 FINDINGS: Gastrostomy tube tip projecting in distal stomach, contrast within the stomach without contrast extravasation. Paucity of bowel gas. Surgical clips in the included right abdomen compatible with cholecystectomy. Soft tissue planes and included osseous structures are non suspicious. IMPRESSION: 1. Intraluminal gastrostomy tube. 2. Non-specific bowel gas pattern. Electronically Signed   By: Awilda Metro M.D.   On: 06/27/2018 22:53   Dg Chest Port 1 View  Result Date: 06/27/2018 CLINICAL DATA:  Respiratory failure. Chronic pneumonia. History of muscular dystrophy. EXAM: PORTABLE CHEST 1 VIEW COMPARISON:  Chest radiograph June 25, 2018 FINDINGS: Extremely low inspiratory examination with crowded vascular markings. Interstitial prominence with patchy bibasilar airspace opacities and small pleural effusions, relatively unchanged. Suspected LEFT lung base pleural calcification. Cardiomediastinal silhouette is not suspicious. Tracheostomy tube in situ. No pneumothorax. Soft tissue planes and included osseous structures are non suspicious. Surgical clips in the included right abdomen compatible with cholecystectomy. IMPRESSION: 1. Chronic interstitial changes with mild bibasilar atelectasis versus residual pneumonia. Small pleural effusions. Electronically Signed   By: Awilda Metro M.D.   On: 06/27/2018 22:54    Assessment/Plan Active Problems:   Acute on chronic respiratory failure with hypoxia (HCC)   Muscular dystrophy (HCC)   Healthcare-associated pneumonia   Tracheostomy status (HCC)   1. Acute on chronic respiratory failure with hypoxia right now patient is weaning on T collar plan is to continue to advance the wean as tolerated continue aggressive pulmonary toilet supportive  care. 2. Muscular dystrophy advanced disease we will continue with supportive care patient may require tracheostomy to remain in place if there is significant advancement of his muscular disorder. 3. Tracheostomy the goal was to continue with home ventilation apparently patient was on trilogy via tracheostomy. 4. Healthcare associated pneumonia patient was treated with Zosyn we will follow-up on the cultures as necessary and x-rays..  Last chest film showed chronic interstitial changes could be consistent with residual pneumonia.  I have personally seen and evaluated the patient, evaluated laboratory and imaging results, formulated the assessment and plan and placed orders. The Patient requires high complexity decision making for assessment and support.  Case was discussed on Rounds with the Respiratory Therapy Staff Time Spent  Yevonne Pax, MD [facility] Pulmonary Critical Care Medicine Sleep Medicine

## 2018-06-30 DIAGNOSIS — J9621 Acute and chronic respiratory failure with hypoxia: Secondary | ICD-10-CM | POA: Diagnosis not present

## 2018-06-30 DIAGNOSIS — Z930 Tracheostomy status: Secondary | ICD-10-CM

## 2018-06-30 DIAGNOSIS — G7100 Muscular dystrophy, unspecified: Secondary | ICD-10-CM | POA: Diagnosis not present

## 2018-06-30 DIAGNOSIS — J189 Pneumonia, unspecified organism: Secondary | ICD-10-CM | POA: Diagnosis not present

## 2018-06-30 LAB — BASIC METABOLIC PANEL
ANION GAP: 6 (ref 5–15)
BUN: 29 mg/dL — ABNORMAL HIGH (ref 8–23)
CALCIUM: 9.9 mg/dL (ref 8.9–10.3)
CO2: 32 mmol/L (ref 22–32)
Chloride: 104 mmol/L (ref 98–111)
Creatinine, Ser: 0.89 mg/dL (ref 0.61–1.24)
GFR calc non Af Amer: >60 mL/min (ref >60)
Glucose, Bld: 98 mg/dL (ref 70–99)
POTASSIUM: 4.7 mmol/L (ref 3.5–5.1)
SODIUM: 142 mmol/L (ref 135–145)

## 2018-06-30 LAB — CBC
HCT: 43.1 % (ref 39.0–52.0)
Hemoglobin: 12.8 g/dL — ABNORMAL LOW (ref 13.0–17.0)
MCH: 29.8 pg (ref 26.0–34.0)
MCHC: 29.7 g/dL — ABNORMAL LOW (ref 30.0–36.0)
MCV: 100.2 fL — ABNORMAL HIGH (ref 80.0–100.0)
NRBC: 0 % (ref 0.0–0.2)
PLATELETS: 375 10*3/uL (ref 150–400)
RBC: 4.3 MIL/uL (ref 4.22–5.81)
RDW: 13.8 % (ref 11.5–15.5)
WBC: 8.1 10*3/uL (ref 4.0–10.5)

## 2018-06-30 LAB — CULTURE, RESPIRATORY W GRAM STAIN

## 2018-06-30 LAB — CULTURE, RESPIRATORY

## 2018-06-30 LAB — MAGNESIUM: MAGNESIUM: 2.7 mg/dL — AB (ref 1.7–2.4)

## 2018-06-30 LAB — PHOSPHORUS: PHOSPHORUS: 3.4 mg/dL (ref 2.5–4.6)

## 2018-06-30 NOTE — Progress Notes (Signed)
Pulmonary Critical Care Medicine [facility] [place]   PULMONARY CRITICAL CARE SERVICE  PROGRESS NOTE  Date of Service: 06/30/2018  Carl Galvan  ZOX:[id number]  DOB: [birth year]   DOA: 06/27/2018  Referring Physician: Carron Curie, MD  HPI: Carl Galvan is a 62 y.o. male seen for follow up of Acute on Chronic Respiratory Failure.  Patient is tolerating T collar and doing fairly well.  Has been off the ventilator during the daytime and resting on the ventilator at nighttime.  Right now is on 35% FiO2  Medications: Reviewed on Rounds  Physical Exam:  Vitals: Temperature 97.3 pulse 64 respiratory rate 13 blood pressure 113/56 saturations 100%  Ventilator Settings mode of ventilation is T collar off the ventilator at this time FiO2 35%  . General: Comfortable at this time . Eyes: Grossly normal lids, irises & conjunctiva . ENT: grossly tongue is normal . Neck: no obvious mass . Cardiovascular: S1 S2 normal no gallop . Respiratory: No rhonchi or rales are noted at this time . Abdomen: soft . Skin: no rash seen on limited exam . Musculoskeletal: not rigid . Psychiatric:unable to assess . Neurologic: no seizure no involuntary movements         Lab Data:   Basic Metabolic Panel: Recent Labs  Lab 06/28/18 0552 06/30/18 0653  NA 140 142  K 4.3 4.7  CL 103 104  CO2 32 32  GLUCOSE 114* 98  BUN 16 29*  CREATININE 0.76 0.89  CALCIUM 9.4 9.9  MG 2.5* 2.7*  PHOS 3.7 3.4    ABG: Recent Labs  Lab 06/27/18 2300  PHART 7.387  PCO2ART 57.0*  PO2ART 70.7*  HCO3 33.5*  O2SAT 93.9    Liver Function Tests: Recent Labs  Lab 06/28/18 0552  AST 60*  ALT 58*  ALKPHOS 160*  BILITOT 0.4  PROT 7.0  ALBUMIN 2.3*   No results for input(s): LIPASE, AMYLASE in the last 168 hours. No results for input(s): AMMONIA in the last 168 hours.  CBC: Recent Labs  Lab 06/28/18 0552 06/30/18 0653  WBC 6.0 8.1  NEUTROABS 4.0  --   HGB 11.5* 12.8*  HCT 38.2* 43.1   MCV 98.7 100.2*  PLT 359 375    Cardiac Enzymes: No results for input(s): CKTOTAL, CKMB, CKMBINDEX, TROPONINI in the last 168 hours.  BNP (last 3 results) No results for input(s): BNP in the last 8760 hours.  ProBNP (last 3 results) No results for input(s): PROBNP in the last 8760 hours.  Radiological Exams: No results found.  Assessment/Plan Active Problems:   Acute on chronic respiratory failure with hypoxia (HCC)   Muscular dystrophy (HCC)   Healthcare-associated pneumonia   Tracheostomy status (HCC)   1. Acute on chronic respiratory failure with hypoxia we will continue with T collar trials continue secretion management pulmonary toilet. 2. Muscular dystrophy stable at this time we will continue present therapy 3. Healthcare associated pneumonia treated clinically improving 4. Tracheostomy will continue with supportive care and try to advance towards full time off the ventilator   I have personally seen and evaluated the patient, evaluated laboratory and imaging results, formulated the assessment and plan and placed orders. The Patient requires high complexity decision making for assessment and support.  Case was discussed on Rounds with the Respiratory Therapy Staff  Yevonne Pax, MD [facility] Pulmonary Critical Care Medicine Sleep Medicine

## 2018-07-01 DIAGNOSIS — J9621 Acute and chronic respiratory failure with hypoxia: Secondary | ICD-10-CM | POA: Diagnosis not present

## 2018-07-01 DIAGNOSIS — G7100 Muscular dystrophy, unspecified: Secondary | ICD-10-CM | POA: Diagnosis not present

## 2018-07-01 DIAGNOSIS — J189 Pneumonia, unspecified organism: Secondary | ICD-10-CM | POA: Diagnosis not present

## 2018-07-01 DIAGNOSIS — Z930 Tracheostomy status: Secondary | ICD-10-CM | POA: Diagnosis not present

## 2018-07-01 NOTE — Progress Notes (Signed)
Pulmonary Critical Care Medicine [facility] [place]   PULMONARY CRITICAL CARE SERVICE  PROGRESS NOTE  Date of Service: 07/01/2018  Carl Galvan  HYQ:[id number]  DOB: 27-May-1956   DOA: 06/27/2018  Referring Physician: Carron Curie, MD  HPI: Carl Galvan is a 62 y.o. male seen for follow up of Acute on Chronic Respiratory Failure.  Patient is on T collar resting on the ventilator at nighttime looks comfortable right now  Medications: Reviewed on Rounds  Physical Exam:  Vitals: Temperature 97.0 pulse 71 respiratory rate 16 blood pressure 127/72 saturations are 97%  Ventilator Settings currently on T collar FiO2 28%  . General: Comfortable at this time . Eyes: Grossly normal lids, irises & conjunctiva . ENT: grossly tongue is normal . Neck: no obvious mass . Cardiovascular: S1 S2 normal no gallop . Respiratory: No rhonchi or rales are noted at this time . Abdomen: soft . Skin: no rash seen on limited exam . Musculoskeletal: not rigid . Psychiatric:unable to assess . Neurologic: no seizure no involuntary movements         Lab Data:   Basic Metabolic Panel: Recent Labs  Lab 06/28/18 0552 06/30/18 0653  NA 140 142  K 4.3 4.7  CL 103 104  CO2 32 32  GLUCOSE 114* 98  BUN 16 29*  CREATININE 0.76 0.89  CALCIUM 9.4 9.9  MG 2.5* 2.7*  PHOS 3.7 3.4    ABG: Recent Labs  Lab 06/27/18 2300  PHART 7.387  PCO2ART 57.0*  PO2ART 70.7*  HCO3 33.5*  O2SAT 93.9    Liver Function Tests: Recent Labs  Lab 06/28/18 0552  AST 60*  ALT 58*  ALKPHOS 160*  BILITOT 0.4  PROT 7.0  ALBUMIN 2.3*   No results for input(s): LIPASE, AMYLASE in the last 168 hours. No results for input(s): AMMONIA in the last 168 hours.  CBC: Recent Labs  Lab 06/28/18 0552 06/30/18 0653  WBC 6.0 8.1  NEUTROABS 4.0  --   HGB 11.5* 12.8*  HCT 38.2* 43.1  MCV 98.7 100.2*  PLT 359 375    Cardiac Enzymes: No results for input(s): CKTOTAL, CKMB, CKMBINDEX, TROPONINI in the  last 168 hours.  BNP (last 3 results) No results for input(s): BNP in the last 8760 hours.  ProBNP (last 3 results) No results for input(s): PROBNP in the last 8760 hours.  Radiological Exams: No results found.  Assessment/Plan Active Problems:   Acute on chronic respiratory failure with hypoxia (HCC)   Muscular dystrophy (HCC)   Healthcare-associated pneumonia   Tracheostomy status (HCC)   1. Acute on chronic respiratory failure hypoxia we will continue OT, continue pulmonary toilet secretion management. 2. Muscular dystrophy at baseline 3. Healthcare associated pneumonia treated 4. Tracheostomy will remain in place   I have personally seen and evaluated the patient, evaluated laboratory and imaging results, formulated the assessment and plan and placed orders. The Patient requires high complexity decision making for assessment and support.  Case was discussed on Rounds with the Respiratory Therapy Staff  Yevonne Pax, MD [facility] Pulmonary Critical Care Medicine Sleep Medicine

## 2018-07-02 DIAGNOSIS — G7100 Muscular dystrophy, unspecified: Secondary | ICD-10-CM | POA: Diagnosis not present

## 2018-07-02 DIAGNOSIS — J189 Pneumonia, unspecified organism: Secondary | ICD-10-CM | POA: Diagnosis not present

## 2018-07-02 DIAGNOSIS — Z930 Tracheostomy status: Secondary | ICD-10-CM | POA: Diagnosis not present

## 2018-07-02 DIAGNOSIS — J9621 Acute and chronic respiratory failure with hypoxia: Secondary | ICD-10-CM | POA: Diagnosis not present

## 2018-07-02 LAB — BASIC METABOLIC PANEL
ANION GAP: 10 (ref 5–15)
BUN: 31 mg/dL — AB (ref 8–23)
CO2: 30 mmol/L (ref 22–32)
Calcium: 10.4 mg/dL — ABNORMAL HIGH (ref 8.9–10.3)
Chloride: 99 mmol/L (ref 98–111)
Creatinine, Ser: 0.69 mg/dL (ref 0.61–1.24)
GFR calc Af Amer: >60 mL/min (ref >60)
Glucose, Bld: 101 mg/dL — ABNORMAL HIGH (ref 70–99)
POTASSIUM: 5.5 mmol/L — AB (ref 3.5–5.1)
Sodium: 139 mmol/L (ref 135–145)

## 2018-07-02 LAB — MAGNESIUM: Magnesium: 2.4 mg/dL (ref 1.7–2.4)

## 2018-07-02 LAB — POTASSIUM: POTASSIUM: 5 mmol/L (ref 3.5–5.1)

## 2018-07-02 NOTE — Progress Notes (Signed)
Pulmonary Critical Care Medicine [facility] [place]   PULMONARY CRITICAL CARE SERVICE  PROGRESS NOTE  Date of Service: 07/02/2018  Carl Galvan  ZOX:[id number]  DOB: [birth year]   DOA: 06/27/2018  Referring Physician: Carron Curie, MD  HPI: Carl Galvan is a 62 y.o. male seen for follow up of Acute on Chronic Respiratory Failure.  Patient is currently on T collar with 35% FiO2 resting on the ventilator at nighttime.  Patient has been on home trilogy ventilator prior to being admitted.  Medications: Reviewed on Rounds  Physical Exam:  Vitals: Temperature 97.4 pulse 72 respiratory 27 blood pressure 99/69 saturations 98%  Ventilator Settings off the ventilator right now on T collar FiO2 35%  . General: Comfortable at this time . Eyes: Grossly normal lids, irises & conjunctiva . ENT: grossly tongue is normal . Neck: no obvious mass . Cardiovascular: S1 S2 normal no gallop . Respiratory: No rhonchi or rales are noted . Abdomen: soft . Skin: no rash seen on limited exam . Musculoskeletal: not rigid . Psychiatric:unable to assess . Neurologic: no seizure no involuntary movements         Lab Data:   Basic Metabolic Panel: Recent Labs  Lab 06/28/18 0552 06/30/18 0653 07/02/18 1050  NA 140 142 139  K 4.3 4.7 5.5*  CL 103 104 99  CO2 32 32 30  GLUCOSE 114* 98 101*  BUN 16 29* 31*  CREATININE 0.76 0.89 0.69  CALCIUM 9.4 9.9 10.4*  MG 2.5* 2.7* 2.4  PHOS 3.7 3.4  --     ABG: Recent Labs  Lab 06/27/18 2300  PHART 7.387  PCO2ART 57.0*  PO2ART 70.7*  HCO3 33.5*  O2SAT 93.9    Liver Function Tests: Recent Labs  Lab 06/28/18 0552  AST 60*  ALT 58*  ALKPHOS 160*  BILITOT 0.4  PROT 7.0  ALBUMIN 2.3*   No results for input(s): LIPASE, AMYLASE in the last 168 hours. No results for input(s): AMMONIA in the last 168 hours.  CBC: Recent Labs  Lab 06/28/18 0552 06/30/18 0653  WBC 6.0 8.1  NEUTROABS 4.0  --   HGB 11.5* 12.8*  HCT 38.2* 43.1   MCV 98.7 100.2*  PLT 359 375    Cardiac Enzymes: No results for input(s): CKTOTAL, CKMB, CKMBINDEX, TROPONINI in the last 168 hours.  BNP (last 3 results) No results for input(s): BNP in the last 8760 hours.  ProBNP (last 3 results) No results for input(s): PROBNP in the last 8760 hours.  Radiological Exams: No results found.  Assessment/Plan Active Problems:   Acute on chronic respiratory failure with hypoxia (HCC)   Muscular dystrophy (HCC)   Healthcare-associated pneumonia   Tracheostomy status (HCC)   1. Acute on chronic respiratory failure with hypoxia we will continue with weaning during the daytime rest on the ventilator at nighttime.  Patient has a history of muscular dystrophy and will require ongoing nocturnal ventilation. 2. Healthcare associated pneumonia treated 3. Tracheostomy.  We will continue with supportive care   I have personally seen and evaluated the patient, evaluated laboratory and imaging results, formulated the assessment and plan and placed orders. The Patient requires high complexity decision making for assessment and support.  Case was discussed on Rounds with the Respiratory Therapy Staff  Yevonne Pax, MD [facility] Pulmonary Critical Care Medicine Sleep Medicine

## 2018-07-03 DIAGNOSIS — J9621 Acute and chronic respiratory failure with hypoxia: Secondary | ICD-10-CM | POA: Diagnosis not present

## 2018-07-03 DIAGNOSIS — Z930 Tracheostomy status: Secondary | ICD-10-CM | POA: Diagnosis not present

## 2018-07-03 DIAGNOSIS — G7100 Muscular dystrophy, unspecified: Secondary | ICD-10-CM | POA: Diagnosis not present

## 2018-07-03 DIAGNOSIS — J189 Pneumonia, unspecified organism: Secondary | ICD-10-CM | POA: Diagnosis not present

## 2018-07-03 NOTE — Progress Notes (Signed)
Pulmonary Critical Care Medicine [facility] [place]   PULMONARY CRITICAL CARE SERVICE  PROGRESS NOTE  Date of Service: 07/03/2018  Carl Galvan  [id number]  DOB: [birth year]   DOA: 06/27/2018  Referring Physician: Carron Curie, MD  HPI: Carl Galvan is a 62 y.o. male seen for follow up of Acute on Chronic Respiratory Failure.  Patient right now is on T-piece has been on 35% FiO2 resting on the ventilator at nighttime tolerating it well  Medications: Reviewed on Rounds  Physical Exam:  Vitals: Temperature 97.5 pulse 79 respiratory 22 blood pressure 176/48 saturation 96%  Ventilator Settings on T-piece right now 35% FiO2  . General: Comfortable at this time . Eyes: Grossly normal lids, irises & conjunctiva . ENT: grossly tongue is normal . Neck: no obvious mass . Cardiovascular: S1 S2 normal no gallop . Respiratory: No rhonchi or rales are noted . Abdomen: soft . Skin: no rash seen on limited exam . Musculoskeletal: not rigid . Psychiatric:unable to assess . Neurologic: no seizure no involuntary movements         Lab Data:   Basic Metabolic Panel: Recent Labs  Lab 06/28/18 0552 06/30/18 0653 07/02/18 1050 07/02/18 1835  NA 140 142 139  --   K 4.3 4.7 5.5* 5.0  CL 103 104 99  --   CO2 32 32 30  --   GLUCOSE 114* 98 101*  --   BUN 16 29* 31*  --   CREATININE 0.76 0.89 0.69  --   CALCIUM 9.4 9.9 10.4*  --   MG 2.5* 2.7* 2.4  --   PHOS 3.7 3.4  --   --     ABG: Recent Labs  Lab 06/27/18 2300  PHART 7.387  PCO2ART 57.0*  PO2ART 70.7*  HCO3 33.5*  O2SAT 93.9    Liver Function Tests: Recent Labs  Lab 06/28/18 0552  AST 60*  ALT 58*  ALKPHOS 160*  BILITOT 0.4  PROT 7.0  ALBUMIN 2.3*   No results for input(s): LIPASE, AMYLASE in the last 168 hours. No results for input(s): AMMONIA in the last 168 hours.  CBC: Recent Labs  Lab 06/28/18 0552 06/30/18 0653  WBC 6.0 8.1  NEUTROABS 4.0  --   HGB 11.5* 12.8*  HCT 38.2* 43.1   MCV 98.7 100.2*  PLT 359 375    Cardiac Enzymes: No results for input(s): CKTOTAL, CKMB, CKMBINDEX, TROPONINI in the last 168 hours.  BNP (last 3 results) No results for input(s): BNP in the last 8760 hours.  ProBNP (last 3 results) No results for input(s): PROBNP in the last 8760 hours.  Radiological Exams: No results found.  Assessment/Plan Active Problems:   Acute on chronic respiratory failure with hypoxia (HCC)   Muscular dystrophy (HCC)   Healthcare-associated pneumonia   Tracheostomy status (HCC)   1. Acute on chronic respiratory failure with hypoxia we will continue with T-piece titrate oxygen as tolerated continue secretion management patient will be resting on the ventilator at nighttime. 2. Muscular dystrophy as mentioned will need ongoing ventilatory support. 3. Healthcare associated pneumonia treated we will continue to follow 4. Tracheostomy status will remain in place at this time.   I have personally seen and evaluated the patient, evaluated laboratory and imaging results, formulated the assessment and plan and placed orders. The Patient requires high complexity decision making for assessment and support.  Case was discussed on Rounds with the Respiratory Therapy Staff  Yevonne Pax, MD [facility] Pulmonary Critical Care Medicine Sleep  Medicine

## 2018-07-04 DIAGNOSIS — J189 Pneumonia, unspecified organism: Secondary | ICD-10-CM | POA: Diagnosis not present

## 2018-07-04 DIAGNOSIS — G7100 Muscular dystrophy, unspecified: Secondary | ICD-10-CM | POA: Diagnosis not present

## 2018-07-04 DIAGNOSIS — Z930 Tracheostomy status: Secondary | ICD-10-CM | POA: Diagnosis not present

## 2018-07-04 DIAGNOSIS — J9621 Acute and chronic respiratory failure with hypoxia: Secondary | ICD-10-CM | POA: Diagnosis not present

## 2018-07-04 LAB — MAGNESIUM: Magnesium: 2.4 mg/dL (ref 1.7–2.4)

## 2018-07-04 NOTE — Progress Notes (Signed)
Pulmonary Critical Care Medicine [facility] [place]   PULMONARY CRITICAL CARE SERVICE  PROGRESS NOTE  Date of Service: 07/04/2018  LEGACY LACIVITA  WUJ:[id number]  DOB: Jan 20, 1956   DOA: 06/27/2018  Referring Physician: Carron Curie, MD  HPI: Carl Galvan is a 62 y.o. male seen for follow up of Acute on Chronic Respiratory Failure.  Currently is on T collar has been on 20% oxygen good saturations are noted.  Secretions are fair to moderate  Medications: Reviewed on Rounds  Physical Exam:  Vitals: Temperature 97.9 pulse 78 respiratory rate 24 blood pressure 95/55 saturations 97%  Ventilator Settings off the ventilator on T collar  . General: Comfortable at this time . Eyes: Grossly normal lids, irises & conjunctiva . ENT: grossly tongue is normal . Neck: no obvious mass . Cardiovascular: S1 S2 normal no gallop . Respiratory: Coarse breath sounds with few rhonchi . Abdomen: soft . Skin: no rash seen on limited exam . Musculoskeletal: not rigid . Psychiatric:unable to assess . Neurologic: no seizure no involuntary movements         Lab Data:   Basic Metabolic Panel: Recent Labs  Lab 06/28/18 0552 06/30/18 0653 07/02/18 1050 07/02/18 1835 07/04/18 1412  NA 140 142 139  --   --   K 4.3 4.7 5.5* 5.0  --   CL 103 104 99  --   --   CO2 32 32 30  --   --   GLUCOSE 114* 98 101*  --   --   BUN 16 29* 31*  --   --   CREATININE 0.76 0.89 0.69  --   --   CALCIUM 9.4 9.9 10.4*  --   --   MG 2.5* 2.7* 2.4  --  2.4  PHOS 3.7 3.4  --   --   --     ABG: Recent Labs  Lab 06/27/18 2300  PHART 7.387  PCO2ART 57.0*  PO2ART 70.7*  HCO3 33.5*  O2SAT 93.9    Liver Function Tests: Recent Labs  Lab 06/28/18 0552  AST 60*  ALT 58*  ALKPHOS 160*  BILITOT 0.4  PROT 7.0  ALBUMIN 2.3*   No results for input(s): LIPASE, AMYLASE in the last 168 hours. No results for input(s): AMMONIA in the last 168 hours.  CBC: Recent Labs  Lab 06/28/18 0552  06/30/18 0653  WBC 6.0 8.1  NEUTROABS 4.0  --   HGB 11.5* 12.8*  HCT 38.2* 43.1  MCV 98.7 100.2*  PLT 359 375    Cardiac Enzymes: No results for input(s): CKTOTAL, CKMB, CKMBINDEX, TROPONINI in the last 168 hours.  BNP (last 3 results) No results for input(s): BNP in the last 8760 hours.  ProBNP (last 3 results) No results for input(s): PROBNP in the last 8760 hours.  Radiological Exams: No results found.  Assessment/Plan Active Problems:   Acute on chronic respiratory failure with hypoxia (HCC)   Muscular dystrophy (HCC)   Healthcare-associated pneumonia   Tracheostomy status (HCC)   1. Acute on chronic respiratory failure with hypoxia we will continue with T collar trials continue aggressive pulmonary toilet supportive care. 2. Muscular dystrophy patient is at baseline we will continue to monitor. 3. Healthcare associated pneumonia treated 4. Tracheostomy will remain in place for nocturnal ventilation   I have personally seen and evaluated the patient, evaluated laboratory and imaging results, formulated the assessment and plan and placed orders. The Patient requires high complexity decision making for assessment and support.  Case was  discussed on Rounds with the Respiratory Therapy Staff  Allyne Gee, MD [facility] Pulmonary Critical Care Medicine Sleep Medicine

## 2018-07-05 DIAGNOSIS — G7100 Muscular dystrophy, unspecified: Secondary | ICD-10-CM | POA: Diagnosis not present

## 2018-07-05 DIAGNOSIS — J9621 Acute and chronic respiratory failure with hypoxia: Secondary | ICD-10-CM | POA: Diagnosis not present

## 2018-07-05 DIAGNOSIS — Z930 Tracheostomy status: Secondary | ICD-10-CM | POA: Diagnosis not present

## 2018-07-05 DIAGNOSIS — J189 Pneumonia, unspecified organism: Secondary | ICD-10-CM | POA: Diagnosis not present

## 2018-07-05 LAB — BASIC METABOLIC PANEL
Anion gap: 8 (ref 5–15)
BUN: 34 mg/dL — AB (ref 8–23)
CALCIUM: 9.6 mg/dL (ref 8.9–10.3)
CO2: 33 mmol/L — ABNORMAL HIGH (ref 22–32)
Chloride: 97 mmol/L — ABNORMAL LOW (ref 98–111)
Creatinine, Ser: 0.87 mg/dL (ref 0.61–1.24)
GFR calc Af Amer: >60 mL/min (ref >60)
GLUCOSE: 131 mg/dL — AB (ref 70–99)
POTASSIUM: 4 mmol/L (ref 3.5–5.1)
SODIUM: 138 mmol/L (ref 135–145)

## 2018-07-05 LAB — CBC
HCT: 41.1 % (ref 39.0–52.0)
Hemoglobin: 12.5 g/dL — ABNORMAL LOW (ref 13.0–17.0)
MCH: 29.5 pg (ref 26.0–34.0)
MCHC: 30.4 g/dL (ref 30.0–36.0)
MCV: 96.9 fL (ref 80.0–100.0)
NRBC: 0 % (ref 0.0–0.2)
PLATELETS: 405 10*3/uL — AB (ref 150–400)
RBC: 4.24 MIL/uL (ref 4.22–5.81)
RDW: 14 % (ref 11.5–15.5)
WBC: 9.9 10*3/uL (ref 4.0–10.5)

## 2018-07-05 LAB — MAGNESIUM: MAGNESIUM: 2.4 mg/dL (ref 1.7–2.4)

## 2018-07-05 NOTE — Progress Notes (Signed)
Pulmonary Critical Care Medicine [facility] [place]   PULMONARY CRITICAL CARE SERVICE  PROGRESS NOTE  Date of Service: 07/05/2018  Carl KLUTTZ  UJW:[id number]  DOB: June 09, 1956   DOA: 06/27/2018  Referring Physician: Carron Curie, MD  HPI: Carl Galvan is a 62 y.o. male seen for follow up of Acute on Chronic Respiratory Failure.  Patient is on T collar currently on 20% oxygen good saturations are noted  Medications: Reviewed on Rounds  Physical Exam:  Vitals: Temperature 98.6 pulse 102 respiratory rate 21 blood pressure 110/68 saturation 98%  Ventilator Settings on T collar FiO2 28%  . General: Comfortable at this time . Eyes: Grossly normal lids, irises & conjunctiva . ENT: grossly tongue is normal . Neck: no obvious mass . Cardiovascular: S1 S2 normal no gallop . Respiratory: No rhonchi or rales are noted . Abdomen: soft . Skin: no rash seen on limited exam . Musculoskeletal: not rigid . Psychiatric:unable to assess . Neurologic: no seizure no involuntary movements         Lab Data:   Basic Metabolic Panel: Recent Labs  Lab 06/30/18 0653 07/02/18 1050 07/02/18 1835 07/04/18 1412 07/05/18 0627  NA 142 139  --   --  138  K 4.7 5.5* 5.0  --  4.0  CL 104 99  --   --  97*  CO2 32 30  --   --  33*  GLUCOSE 98 101*  --   --  131*  BUN 29* 31*  --   --  34*  CREATININE 0.89 0.69  --   --  0.87  CALCIUM 9.9 10.4*  --   --  9.6  MG 2.7* 2.4  --  2.4 2.4  PHOS 3.4  --   --   --   --     ABG: No results for input(s): PHART, PCO2ART, PO2ART, HCO3, O2SAT in the last 168 hours.  Liver Function Tests: No results for input(s): AST, ALT, ALKPHOS, BILITOT, PROT, ALBUMIN in the last 168 hours. No results for input(s): LIPASE, AMYLASE in the last 168 hours. No results for input(s): AMMONIA in the last 168 hours.  CBC: Recent Labs  Lab 06/30/18 0653 07/05/18 0627  WBC 8.1 9.9  HGB 12.8* 12.5*  HCT 43.1 41.1  MCV 100.2* 96.9  PLT 375 405*     Cardiac Enzymes: No results for input(s): CKTOTAL, CKMB, CKMBINDEX, TROPONINI in the last 168 hours.  BNP (last 3 results) No results for input(s): BNP in the last 8760 hours.  ProBNP (last 3 results) No results for input(s): PROBNP in the last 8760 hours.  Radiological Exams: No results found.  Assessment/Plan Active Problems:   Acute on chronic respiratory failure with hypoxia (HCC)   Muscular dystrophy (HCC)   Healthcare-associated pneumonia   Tracheostomy status (HCC)   1. Acute on chronic respiratory failure with hypoxia we will continue with weaning on T collar secretions are very moderate continue aggressive pulmonary toilet. 2. Muscular dystrophy at baseline 3. Healthcare associated pneumonia treated will monitor 4. Tracheostomy will remain in place   I have personally seen and evaluated the patient, evaluated laboratory and imaging results, formulated the assessment and plan and placed orders. The Patient requires high complexity decision making for assessment and support.  Case was discussed on Rounds with the Respiratory Therapy Staff  Carl Pax, MD [facility] Pulmonary Critical Care Medicine Sleep Medicine

## 2018-07-06 DIAGNOSIS — J9621 Acute and chronic respiratory failure with hypoxia: Secondary | ICD-10-CM | POA: Diagnosis not present

## 2018-07-06 DIAGNOSIS — Z930 Tracheostomy status: Secondary | ICD-10-CM | POA: Diagnosis not present

## 2018-07-06 DIAGNOSIS — G7100 Muscular dystrophy, unspecified: Secondary | ICD-10-CM | POA: Diagnosis not present

## 2018-07-06 DIAGNOSIS — J189 Pneumonia, unspecified organism: Secondary | ICD-10-CM | POA: Diagnosis not present

## 2018-07-06 NOTE — Progress Notes (Signed)
Pulmonary Critical Care Medicine [facility] [place]   PULMONARY CRITICAL CARE SERVICE  PROGRESS NOTE  Date of Service: 07/06/2018  Carl Galvan  ZOX:[id number]  DOB: 1955-11-08   DOA: 06/27/2018  Referring Physician: Carron Curie, MD  HPI: Carl Galvan is a 62 y.o. male seen for follow up of Acute on Chronic Respiratory Failure.  Patient is on T collar currently 28% FiO2.  Resting on the ventilator at nighttime  Medications: Reviewed on Rounds  Physical Exam:  Vitals: Temperature 97.4 pulse 79 respiratory rate 20 blood pressure 121/68 saturation 98%  Ventilator Settings currently on T collar FiO2 28%  . General: Comfortable at this time . Eyes: Grossly normal lids, irises & conjunctiva . ENT: grossly tongue is normal . Neck: no obvious mass . Cardiovascular: S1 S2 normal no gallop . Respiratory: No rhonchi or rales are noted at this time . Abdomen: soft . Skin: no rash seen on limited exam . Musculoskeletal: not rigid . Psychiatric:unable to assess . Neurologic: no seizure no involuntary movements         Lab Data:   Basic Metabolic Panel: Recent Labs  Lab 06/30/18 0653 07/02/18 1050 07/02/18 1835 07/04/18 1412 07/05/18 0627  NA 142 139  --   --  138  K 4.7 5.5* 5.0  --  4.0  CL 104 99  --   --  97*  CO2 32 30  --   --  33*  GLUCOSE 98 101*  --   --  131*  BUN 29* 31*  --   --  34*  CREATININE 0.89 0.69  --   --  0.87  CALCIUM 9.9 10.4*  --   --  9.6  MG 2.7* 2.4  --  2.4 2.4  PHOS 3.4  --   --   --   --     ABG: No results for input(s): PHART, PCO2ART, PO2ART, HCO3, O2SAT in the last 168 hours.  Liver Function Tests: No results for input(s): AST, ALT, ALKPHOS, BILITOT, PROT, ALBUMIN in the last 168 hours. No results for input(s): LIPASE, AMYLASE in the last 168 hours. No results for input(s): AMMONIA in the last 168 hours.  CBC: Recent Labs  Lab 06/30/18 0653 07/05/18 0627  WBC 8.1 9.9  HGB 12.8* 12.5*  HCT 43.1 41.1  MCV 100.2*  96.9  PLT 375 405*    Cardiac Enzymes: No results for input(s): CKTOTAL, CKMB, CKMBINDEX, TROPONINI in the last 168 hours.  BNP (last 3 results) No results for input(s): BNP in the last 8760 hours.  ProBNP (last 3 results) No results for input(s): PROBNP in the last 8760 hours.  Radiological Exams: No results found.  Assessment/Plan Active Problems:   Acute on chronic respiratory failure with hypoxia (HCC)   Muscular dystrophy (HCC)   Healthcare-associated pneumonia   Tracheostomy status (HCC)   1. Acute on chronic respiratory failure with hypoxia we will continue with T collar trials rest on the ventilator at nighttime 2. Muscular dystrophy patient is at baseline 3. Healthcare associated pneumonia treated resolved 4. Tracheostomy will remain in place   I have personally seen and evaluated the patient, evaluated laboratory and imaging results, formulated the assessment and plan and placed orders. The Patient requires high complexity decision making for assessment and support.  Case was discussed on Rounds with the Respiratory Therapy Staff  Yevonne Pax, MD [facility] Pulmonary Critical Care Medicine Sleep Medicine

## 2018-07-07 DIAGNOSIS — J9621 Acute and chronic respiratory failure with hypoxia: Secondary | ICD-10-CM | POA: Diagnosis not present

## 2018-07-07 DIAGNOSIS — Z930 Tracheostomy status: Secondary | ICD-10-CM | POA: Diagnosis not present

## 2018-07-07 DIAGNOSIS — G7100 Muscular dystrophy, unspecified: Secondary | ICD-10-CM | POA: Diagnosis not present

## 2018-07-07 DIAGNOSIS — J189 Pneumonia, unspecified organism: Secondary | ICD-10-CM | POA: Diagnosis not present

## 2018-07-07 NOTE — Progress Notes (Signed)
Pulmonary Critical Care Medicine [facility] [place]   PULMONARY CRITICAL CARE SERVICE  PROGRESS NOTE  Date of Service: 07/07/2018  Carl Galvan  [id number]:[id number]  DOB: 09/02/1955   DOA: 06/27/2018  Referring Physician: Carron Curie, MD  HPI: Carl Galvan is a 62 y.o. male seen for follow up of Acute on Chronic Respiratory Failure.  Patient is on T collar at this time resting on the ventilator at nighttime basically at baseline right now  Medications: Reviewed on Rounds  Physical Exam:  Vitals: Temperature 97.7 pulse 79 respiratory rate 20 blood pressure 165 saturations 95%  Ventilator Settings currently is on T collar FiO2 28%  . General: Comfortable at this time . Eyes: Grossly normal lids, irises & conjunctiva . ENT: grossly tongue is normal . Neck: no obvious mass . Cardiovascular: S1 S2 normal no gallop . Respiratory: Coarse breath sounds no rhonchi . Abdomen: soft . Skin: no rash seen on limited exam . Musculoskeletal: not rigid . Psychiatric:unable to assess . Neurologic: no seizure no involuntary movements         Lab Data:   Basic Metabolic Panel: Recent Labs  Lab 07/02/18 1050 07/02/18 1835 07/04/18 1412 07/05/18 0627  NA 139  --   --  138  K 5.5* 5.0  --  4.0  CL 99  --   --  97*  CO2 30  --   --  33*  GLUCOSE 101*  --   --  131*  BUN 31*  --   --  34*  CREATININE 0.69  --   --  0.87  CALCIUM 10.4*  --   --  9.6  MG 2.4  --  2.4 2.4    ABG: No results for input(s): PHART, PCO2ART, PO2ART, HCO3, O2SAT in the last 168 hours.  Liver Function Tests: No results for input(s): AST, ALT, ALKPHOS, BILITOT, PROT, ALBUMIN in the last 168 hours. No results for input(s): LIPASE, AMYLASE in the last 168 hours. No results for input(s): AMMONIA in the last 168 hours.  CBC: Recent Labs  Lab 07/05/18 0627  WBC 9.9  HGB 12.5*  HCT 41.1  MCV 96.9  PLT 405*    Cardiac Enzymes: No results for input(s): CKTOTAL, CKMB, CKMBINDEX, TROPONINI in  the last 168 hours.  BNP (last 3 results) No results for input(s): BNP in the last 8760 hours.  ProBNP (last 3 results) No results for input(s): PROBNP in the last 8760 hours.  Radiological Exams: No results found.  Assessment/Plan Active Problems:   Acute on chronic respiratory failure with hypoxia (HCC)   Muscular dystrophy (HCC)   Healthcare-associated pneumonia   Tracheostomy status (HCC)   1. Acute on chronic respiratory failure with hypoxia we will continue with the T collar rest on the ventilator at nighttime to continue pulmonary toilet secretion management. 2. Muscular dystrophy at this time continue to monitor. 3. Healthcare associated pneumonia treated clinically seems to be improving 4. Tracheostomy will remain in place we will continue to follow   I have personally seen and evaluated the patient, evaluated laboratory and imaging results, formulated the assessment and plan and placed orders. The Patient requires high complexity decision making for assessment and support.  Case was discussed on Rounds with the Respiratory Therapy Staff  Yevonne Pax, MD [address] [facility] Pulmonary Critical Care Medicine Sleep Medicine

## 2018-07-08 DIAGNOSIS — J9621 Acute and chronic respiratory failure with hypoxia: Secondary | ICD-10-CM | POA: Diagnosis not present

## 2018-07-08 DIAGNOSIS — Z930 Tracheostomy status: Secondary | ICD-10-CM | POA: Diagnosis not present

## 2018-07-08 DIAGNOSIS — J189 Pneumonia, unspecified organism: Secondary | ICD-10-CM | POA: Diagnosis not present

## 2018-07-08 DIAGNOSIS — G7100 Muscular dystrophy, unspecified: Secondary | ICD-10-CM | POA: Diagnosis not present

## 2018-07-08 NOTE — Progress Notes (Signed)
[facility] [place]   PULMONARY CRITICAL CARE SERVICE  PROGRESS NOTE  Date of Service: 07/08/2018  Carl Galvan  ZOX:[id number]  DOB: 06/21/1956   DOA: 06/27/2018  Referring Physician: Carron CurieAli Hijazi, MD  HPI: Carl Galvan is a 62 y.o. male seen for follow up of Acute on Chronic Respiratory Failure.  Patient is at baseline on T collar has been requiring 28% FiO2 good saturations are noted at this time.  Medications: Reviewed on Rounds  Physical Exam:  Vitals: Temperature 97.3 pulse 76 respiratory 18 blood pressure 118/58 saturations 97%  Ventilator Settings off the ventilator on T collar  . General: Comfortable at this time . Eyes: Grossly normal lids, irises & conjunctiva . ENT: grossly tongue is normal . Neck: no obvious mass . Cardiovascular: S1 S2 normal no gallop . Respiratory: No rhonchi no rales . Abdomen: soft . Skin: no rash seen on limited exam . Musculoskeletal: not rigid . Psychiatric:unable to assess . Neurologic: no seizure no involuntary movements         Lab Data:   Basic Metabolic Panel: Recent Labs  Lab 07/02/18 1050 07/02/18 1835 07/04/18 1412 07/05/18 0627  NA 139  --   --  138  K 5.5* 5.0  --  4.0  CL 99  --   --  97*  CO2 30  --   --  33*  GLUCOSE 101*  --   --  131*  BUN 31*  --   --  34*  CREATININE 0.69  --   --  0.87  CALCIUM 10.4*  --   --  9.6  MG 2.4  --  2.4 2.4    ABG: No results for input(s): PHART, PCO2ART, PO2ART, HCO3, O2SAT in the last 168 hours.  Liver Function Tests: No results for input(s): AST, ALT, ALKPHOS, BILITOT, PROT, ALBUMIN in the last 168 hours. No results for input(s): LIPASE, AMYLASE in the last 168 hours. No results for input(s): AMMONIA in the last 168 hours.  CBC: Recent Labs  Lab 07/05/18 0627  WBC 9.9  HGB 12.5*  HCT 41.1  MCV 96.9  PLT 405*    Cardiac Enzymes: No results for input(s): CKTOTAL, CKMB, CKMBINDEX, TROPONINI in the last 168  hours.  BNP (last 3 results) No results for input(s): BNP in the last 8760 hours.  ProBNP (last 3 results) No results for input(s): PROBNP in the last 8760 hours.  Radiological Exams: No results found.  Assessment/Plan Active Problems:   Acute on chronic respiratory failure with hypoxia (HCC)   Muscular dystrophy (HCC)   Healthcare-associated pneumonia   Tracheostomy status (HCC)   1. Acute on chronic respiratory failure with hypoxia we will continue to wean on T collar titrate oxygen as tolerated continue pulmonary toilet. 2. Muscular dystrophy at baseline remains on the ventilator at night 3. Healthcare associated pneumonia treated we will continue to follow 4. Tracheostomy remains in place no decannulation is planned   I have personally seen and evaluated the patient, evaluated laboratory and imaging results, formulated the assessment and plan and placed orders. The Patient requires high complexity decision making for assessment and support.  Case was discussed on Rounds with the Respiratory Therapy Staff  Yevonne PaxSaadat A Anique Beckley, MD [facility] Pulmonary Critical Care Medicine Sleep Medicine

## 2018-07-09 DIAGNOSIS — J189 Pneumonia, unspecified organism: Secondary | ICD-10-CM | POA: Diagnosis not present

## 2018-07-09 DIAGNOSIS — J9621 Acute and chronic respiratory failure with hypoxia: Secondary | ICD-10-CM | POA: Diagnosis not present

## 2018-07-09 DIAGNOSIS — Z930 Tracheostomy status: Secondary | ICD-10-CM | POA: Diagnosis not present

## 2018-07-09 DIAGNOSIS — G7100 Muscular dystrophy, unspecified: Secondary | ICD-10-CM | POA: Diagnosis not present

## 2018-07-09 LAB — BASIC METABOLIC PANEL
Anion gap: 6 (ref 5–15)
BUN: 30 mg/dL — AB (ref 8–23)
CHLORIDE: 101 mmol/L (ref 98–111)
CO2: 32 mmol/L (ref 22–32)
Calcium: 9.8 mg/dL (ref 8.9–10.3)
Creatinine, Ser: 0.8 mg/dL (ref 0.61–1.24)
GFR calc Af Amer: >60 mL/min (ref >60)
GFR calc non Af Amer: >60 mL/min (ref >60)
Glucose, Bld: 90 mg/dL (ref 70–99)
POTASSIUM: 4.5 mmol/L (ref 3.5–5.1)
SODIUM: 139 mmol/L (ref 135–145)

## 2018-07-09 LAB — CBC
HCT: 40.3 % (ref 39.0–52.0)
HEMOGLOBIN: 12.5 g/dL — AB (ref 13.0–17.0)
MCH: 30.3 pg (ref 26.0–34.0)
MCHC: 31 g/dL (ref 30.0–36.0)
MCV: 97.8 fL (ref 80.0–100.0)
Platelets: 340 10*3/uL (ref 150–400)
RBC: 4.12 MIL/uL — AB (ref 4.22–5.81)
RDW: 13.7 % (ref 11.5–15.5)
WBC: 6.4 10*3/uL (ref 4.0–10.5)
nRBC: 0 % (ref 0.0–0.2)

## 2018-07-09 LAB — MAGNESIUM: MAGNESIUM: 2.5 mg/dL — AB (ref 1.7–2.4)

## 2018-07-09 LAB — PHOSPHORUS: Phosphorus: 3.8 mg/dL (ref 2.5–4.6)

## 2018-07-09 NOTE — Progress Notes (Signed)
Pulmonary Critical Care Medicine [facility] [place]   PULMONARY CRITICAL CARE SERVICE  PROGRESS NOTE  Date of Service: 07/09/2018  Carl Galvan Chiusano  [id number]:[id number]  DOB: 07/08/1956   DOA: 06/27/2018  Referring Physician: Carron CurieAli Hijazi, MD  HPI: Carl Galvan Able is Carl 62 y.o. male seen for follow up of Acute on Chronic Respiratory Failure.  Patient currently is on T collar tolerating PMV.  Is at baseline no distress is noted at this time.  Medications: Reviewed on Rounds  Physical Exam:  Vitals: Temperature 97.6 pulse 75 respiratory 19 blood pressure 99/78 saturation 97%  Ventilator Settings currently is off the ventilator on T collar trials  . General: Comfortable at this time . Eyes: Grossly normal lids, irises & conjunctiva . ENT: grossly tongue is normal . Neck: no obvious mass . Cardiovascular: S1 S2 normal no gallop . Respiratory: No rhonchi or rales are noted at this time . Abdomen: soft . Skin: no rash seen on limited exam . Musculoskeletal: not rigid . Psychiatric:unable to assess . Neurologic: no seizure no involuntary movements         Lab Data:   Basic Metabolic Panel: Recent Labs  Lab 07/02/18 1835 07/04/18 1412 07/05/18 0627 07/09/18 0533  NA  --   --  138 139  K 5.0  --  4.0 4.5  CL  --   --  97* 101  CO2  --   --  33* 32  GLUCOSE  --   --  131* 90  BUN  --   --  34* 30*  CREATININE  --   --  0.87 0.80  CALCIUM  --   --  9.6 9.8  MG  --  2.4 2.4 2.5*  PHOS  --   --   --  3.8    ABG: No results for input(s): PHART, PCO2ART, PO2ART, HCO3, O2SAT in the last 168 hours.  Liver Function Tests: No results for input(s): AST, ALT, ALKPHOS, BILITOT, PROT, ALBUMIN in the last 168 hours. No results for input(s): LIPASE, AMYLASE in the last 168 hours. No results for input(s): AMMONIA in the last 168 hours.  CBC: Recent Labs  Lab 07/05/18 0627 07/09/18 0533  WBC 9.9 6.4  HGB 12.5* 12.5*  HCT 41.1 40.3  MCV 96.9 97.8  PLT 405* 340     Cardiac Enzymes: No results for input(s): CKTOTAL, CKMB, CKMBINDEX, TROPONINI in the last 168 hours.  BNP (last 3 results) No results for input(s): BNP in the last 8760 hours.  ProBNP (last 3 results) No results for input(s): PROBNP in the last 8760 hours.  Radiological Exams: No results found.  Assessment/Plan Active Problems:   Acute on chronic respiratory failure with hypoxia (HCC)   Muscular dystrophy (HCC)   Healthcare-associated pneumonia   Tracheostomy status (HCC)   1. Acute on chronic respiratory failure with hypoxia we will continue with weaning on T collar continue pulmonary toilet secretion management.  Patient has been tolerating the PMV fairly well during the daytime and resting on the ventilator at nighttime. 2. Muscular dystrophy off the ventilator daytime on the ventilator at nighttime continue therapy as tolerated. 3. Healthcare associated pneumonia treated clinically improved 4. Tracheostomy will remain in place   I have personally seen and evaluated the patient, evaluated laboratory and imaging results, formulated the assessment and plan and placed orders. The Patient requires high complexity decision making for assessment and support.  Case was discussed on Rounds with the Respiratory Therapy Staff  Carl Galvan Carl Jawan Chavarria,  MD [facility] Pulmonary Critical Care Medicine Sleep Medicine

## 2018-07-10 DIAGNOSIS — J189 Pneumonia, unspecified organism: Secondary | ICD-10-CM | POA: Diagnosis not present

## 2018-07-10 DIAGNOSIS — G7100 Muscular dystrophy, unspecified: Secondary | ICD-10-CM | POA: Diagnosis not present

## 2018-07-10 DIAGNOSIS — Z930 Tracheostomy status: Secondary | ICD-10-CM | POA: Diagnosis not present

## 2018-07-10 DIAGNOSIS — J9621 Acute and chronic respiratory failure with hypoxia: Secondary | ICD-10-CM | POA: Diagnosis not present

## 2018-07-10 NOTE — Progress Notes (Signed)
[facility] [place]   PULMONARY CRITICAL CARE SERVICE  PROGRESS NOTE  Date of Service: 07/10/2018  Carl Catholicndre R Moree  WUJ:[id number]  DOB: 10/14/1955   DOA: 06/27/2018  Referring Physician: Carron CurieAli Hijazi, MD  HPI: Carl Galvan is a 62 y.o. male seen for follow up of Acute on Chronic Respiratory Failure.  Patient is on T collar at this time on 28% at his baseline.  Tolerating it well patient is resting on the ventilator at nighttime  Medications: Reviewed on Rounds  Physical Exam:  Vitals: Temperature 97.4 pulse 82 respiratory rate 18 blood pressure 119/74 saturation 95%  Ventilator Settings off the ventilator on T collar currently  . General: Comfortable at this time . Eyes: Grossly normal lids, irises & conjunctiva . ENT: grossly tongue is normal . Neck: no obvious mass . Cardiovascular: S1 S2 normal no gallop . Respiratory: No rhonchi no rales . Abdomen: soft . Skin: no rash seen on limited exam . Musculoskeletal: not rigid . Psychiatric:unable to assess . Neurologic: no seizure no involuntary movements         Lab Data:   Basic Metabolic Panel: Recent Labs  Lab 07/04/18 1412 07/05/18 0627 07/09/18 0533  NA  --  138 139  K  --  4.0 4.5  CL  --  97* 101  CO2  --  33* 32  GLUCOSE  --  131* 90  BUN  --  34* 30*  CREATININE  --  0.87 0.80  CALCIUM  --  9.6 9.8  MG 2.4 2.4 2.5*  PHOS  --   --  3.8    ABG: No results for input(s): PHART, PCO2ART, PO2ART, HCO3, O2SAT in the last 168 hours.  Liver Function Tests: No results for input(s): AST, ALT, ALKPHOS, BILITOT, PROT, ALBUMIN in the last 168 hours. No results for input(s): LIPASE, AMYLASE in the last 168 hours. No results for input(s): AMMONIA in the last 168 hours.  CBC: Recent Labs  Lab 07/05/18 0627 07/09/18 0533  WBC 9.9 6.4  HGB 12.5* 12.5*  HCT 41.1 40.3  MCV 96.9 97.8  PLT 405* 340    Cardiac Enzymes: No results for input(s): CKTOTAL, CKMB,  CKMBINDEX, TROPONINI in the last 168 hours.  BNP (last 3 results) No results for input(s): BNP in the last 8760 hours.  ProBNP (last 3 results) No results for input(s): PROBNP in the last 8760 hours.  Radiological Exams: No results found.  Assessment/Plan Active Problems:   Acute on chronic respiratory failure with hypoxia (HCC)   Muscular dystrophy (HCC)   Healthcare-associated pneumonia   Tracheostomy status (HCC)   1. Acute on chronic respiratory failure with hypoxia we will continue weaning on T collar continue secretion management pulmonary toilet.  Rest on the ventilator at nighttime 2. Muscular dystrophy treated at baseline 3. Healthcare associated pneumonia treated we will continue to follow. 4. Tracheostomy remains in place   I have personally seen and evaluated the patient, evaluated laboratory and imaging results, formulated the assessment and plan and placed orders. The Patient requires high complexity decision making for assessment and support.  Case was discussed on Rounds with the Respiratory Therapy Staff  Yevonne PaxSaadat A Arlyne Brandes, MD [facility] Pulmonary Critical Care Medicine Sleep Medicine

## 2018-07-11 DIAGNOSIS — J189 Pneumonia, unspecified organism: Secondary | ICD-10-CM | POA: Diagnosis not present

## 2018-07-11 DIAGNOSIS — G7100 Muscular dystrophy, unspecified: Secondary | ICD-10-CM | POA: Diagnosis not present

## 2018-07-11 DIAGNOSIS — Z930 Tracheostomy status: Secondary | ICD-10-CM | POA: Diagnosis not present

## 2018-07-11 DIAGNOSIS — J9621 Acute and chronic respiratory failure with hypoxia: Secondary | ICD-10-CM | POA: Diagnosis not present

## 2018-07-11 NOTE — Progress Notes (Signed)
[facility] [place]   PULMONARY CRITICAL CARE SERVICE  PROGRESS NOTE  Date of Service: 07/11/2018  Carl Catholicndre R Wren  ZOX:[id number]  DOB: 09/18/1955   DOA: 06/27/2018  Referring Physician: Carron CurieAli Hijazi, MD  HPI: Carl Galvan is a 62 y.o. male seen for follow up of Acute on Chronic Respiratory Failure.  Patient is on T collar at this time resting on the ventilator at night  Medications: Reviewed on Rounds  Physical Exam:  Vitals: Temperature 98.6 pulse 74 respiratory rate 21 blood pressure 95/58 saturation 98%  Ventilator Settings off the ventilator right now on T collar  . General: Comfortable at this time . Eyes: Grossly normal lids, irises & conjunctiva . ENT: grossly tongue is normal . Neck: no obvious mass . Cardiovascular: S1 S2 normal no gallop . Respiratory: No rhonchi no rales . Abdomen: soft . Skin: no rash seen on limited exam . Musculoskeletal: not rigid . Psychiatric:unable to assess . Neurologic: no seizure no involuntary movements         Lab Data:   Basic Metabolic Panel: Recent Labs  Lab 07/04/18 1412 07/05/18 0627 07/09/18 0533  NA  --  138 139  K  --  4.0 4.5  CL  --  97* 101  CO2  --  33* 32  GLUCOSE  --  131* 90  BUN  --  34* 30*  CREATININE  --  0.87 0.80  CALCIUM  --  9.6 9.8  MG 2.4 2.4 2.5*  PHOS  --   --  3.8    ABG: No results for input(s): PHART, PCO2ART, PO2ART, HCO3, O2SAT in the last 168 hours.  Liver Function Tests: No results for input(s): AST, ALT, ALKPHOS, BILITOT, PROT, ALBUMIN in the last 168 hours. No results for input(s): LIPASE, AMYLASE in the last 168 hours. No results for input(s): AMMONIA in the last 168 hours.  CBC: Recent Labs  Lab 07/05/18 0627 07/09/18 0533  WBC 9.9 6.4  HGB 12.5* 12.5*  HCT 41.1 40.3  MCV 96.9 97.8  PLT 405* 340    Cardiac Enzymes: No results for input(s): CKTOTAL, CKMB, CKMBINDEX, TROPONINI in the last 168 hours.  BNP (last 3  results) No results for input(s): BNP in the last 8760 hours.  ProBNP (last 3 results) No results for input(s): PROBNP in the last 8760 hours.  Radiological Exams: No results found.  Assessment/Plan Active Problems:   Acute on chronic respiratory failure with hypoxia (HCC)   Muscular dystrophy (HCC)   Healthcare-associated pneumonia   Tracheostomy status (HCC)   1. Acute on chronic respiratory failure with hypoxia we will continue with T collar wean during the daytime rest on the vent at night continue aggressive pulmonary toilet and supportive care. 2. Muscular dystrophy at baseline continue to monitor 3. Healthcare associated pneumonia treated we will follow 4. Tracheostomy remains in place we will continue present management   I have personally seen and evaluated the patient, evaluated laboratory and imaging results, formulated the assessment and plan and placed orders. The Patient requires high complexity decision making for assessment and support.  Case was discussed on Rounds with the Respiratory Therapy Staff  Yevonne PaxSaadat A Khan, MD [facility] Pulmonary Critical Care Medicine Sleep Medicine

## 2018-07-12 DIAGNOSIS — J9621 Acute and chronic respiratory failure with hypoxia: Secondary | ICD-10-CM | POA: Diagnosis not present

## 2018-07-12 DIAGNOSIS — J189 Pneumonia, unspecified organism: Secondary | ICD-10-CM | POA: Diagnosis not present

## 2018-07-12 DIAGNOSIS — Z930 Tracheostomy status: Secondary | ICD-10-CM | POA: Diagnosis not present

## 2018-07-12 DIAGNOSIS — G7100 Muscular dystrophy, unspecified: Secondary | ICD-10-CM | POA: Diagnosis not present

## 2018-07-12 NOTE — Progress Notes (Signed)
Pulmonary Critical [facility]   PULMONARY CRITICAL CARE SERVICE  PROGRESS NOTE  Date of Service: 07/12/2018  Carl Catholicndre R Laver  WUJ:[id number]  DOB: 04/12/1956   DOA: 06/27/2018  Referring Physician: Carron CurieAli Hijazi, MD  HPI: Carl Galvan is a 62 y.o. male seen for follow up of Acute on Chronic Respiratory Failure.  Patient is comfortable at this time without distress.  Has been on T collar 28% resting on the ventilator at night  Medications: Reviewed on Rounds  Physical Exam:  Vitals: Temperature 98.1 pulse 86 respiratory rate 21 blood pressure 114/58 saturations 97%  Ventilator Settings off the ventilator on T collar right now  . General: Comfortable at this time . Eyes: Grossly normal lids, irises & conjunctiva . ENT: grossly tongue is normal . Neck: no obvious mass . Cardiovascular: S1 S2 normal no gallop . Respiratory: No rhonchi no rales . Abdomen: soft . Skin: no rash seen on limited exam . Musculoskeletal: not rigid . Psychiatric:unable to assess . Neurologic: no seizure no involuntary movements         Lab Data:   Basic Metabolic Panel: Recent Labs  Lab 07/09/18 0533  NA 139  K 4.5  CL 101  CO2 32  GLUCOSE 90  BUN 30*  CREATININE 0.80  CALCIUM 9.8  MG 2.5*  PHOS 3.8    ABG: No results for input(s): PHART, PCO2ART, PO2ART, HCO3, O2SAT in the last 168 hours.  Liver Function Tests: No results for input(s): AST, ALT, ALKPHOS, BILITOT, PROT, ALBUMIN in the last 168 hours. No results for input(s): LIPASE, AMYLASE in the last 168 hours. No results for input(s): AMMONIA in the last 168 hours.  CBC: Recent Labs  Lab 07/09/18 0533  WBC 6.4  HGB 12.5*  HCT 40.3  MCV 97.8  PLT 340    Cardiac Enzymes: No results for input(s): CKTOTAL, CKMB, CKMBINDEX, TROPONINI in the last 168 hours.  BNP (last 3 results) No results for input(s): BNP in the last 8760 hours.  ProBNP (last 3 results) No results for input(s): PROBNP  in the last 8760 hours.  Radiological Exams: No results found.  Assessment/Plan Active Problems:   Acute on chronic respiratory failure with hypoxia (HCC)   Muscular dystrophy (HCC)   Healthcare-associated pneumonia   Tracheostomy status (HCC)   1. Acute on chronic respiratory failure with hypoxia we will continue with T collar weaning continue secretion management pulmonary toilet. 2. Muscular dystrophy at baseline rest on the ventilator at night 3. Healthcare associated pneumonia treated resolved 4. Tracheostomy remains in place   I have personally seen and evaluated the patient, evaluated laboratory and imaging results, formulated the assessment and plan and placed orders. The Patient requires high complexity decision making for assessment and support.  Case was discussed on Rounds with the Respiratory Therapy Staff  Yevonne PaxSaadat A Khan, MD [facility] Pulmonary Critical Care Medicine Sleep Medicine

## 2018-07-13 DIAGNOSIS — Z930 Tracheostomy status: Secondary | ICD-10-CM | POA: Diagnosis not present

## 2018-07-13 DIAGNOSIS — J189 Pneumonia, unspecified organism: Secondary | ICD-10-CM | POA: Diagnosis not present

## 2018-07-13 DIAGNOSIS — G7100 Muscular dystrophy, unspecified: Secondary | ICD-10-CM | POA: Diagnosis not present

## 2018-07-13 DIAGNOSIS — J9621 Acute and chronic respiratory failure with hypoxia: Secondary | ICD-10-CM | POA: Diagnosis not present

## 2018-07-13 NOTE — Progress Notes (Signed)
[facility]   PULMONARY CRITICAL CARE SERVICE  PROGRESS NOTE  Date of Service: 07/13/2018  Carl Galvan  EXB:[id number]  DOB: 12/06/1955   DOA: 06/27/2018  Referring Physician: Carron CurieAli Hijazi, MD  HPI: Carl Catholicndre R Gros is a 62 y.o. male seen for follow up of Acute on Chronic Respiratory Failure.  Patient is on T collar currently without distress at this time.  Medications: Reviewed on Rounds  Physical Exam:  Vitals: Temperature 97.5 pulse 72 respiratory rate 16 blood pressure 105/64 saturations 97%  Ventilator Settings currently off the ventilator on T collar  . General: Comfortable at this time . Eyes: Grossly normal lids, irises & conjunctiva . ENT: grossly tongue is normal . Neck: no obvious mass . Cardiovascular: S1 S2 normal no gallop . Respiratory: No rhonchi or rales are noted . Abdomen: soft . Skin: no rash seen on limited exam . Musculoskeletal: not rigid . Psychiatric:unable to assess . Neurologic: no seizure no involuntary movements         Lab Data:   Basic Metabolic Panel: Recent Labs  Lab 07/09/18 0533  NA 139  K 4.5  CL 101  CO2 32  GLUCOSE 90  BUN 30*  CREATININE 0.80  CALCIUM 9.8  MG 2.5*  PHOS 3.8    ABG: No results for input(s): PHART, PCO2ART, PO2ART, HCO3, O2SAT in the last 168 hours.  Liver Function Tests: No results for input(s): AST, ALT, ALKPHOS, BILITOT, PROT, ALBUMIN in the last 168 hours. No results for input(s): LIPASE, AMYLASE in the last 168 hours. No results for input(s): AMMONIA in the last 168 hours.  CBC: Recent Labs  Lab 07/09/18 0533  WBC 6.4  HGB 12.5*  HCT 40.3  MCV 97.8  PLT 340    Cardiac Enzymes: No results for input(s): CKTOTAL, CKMB, CKMBINDEX, TROPONINI in the last 168 hours.  BNP (last 3 results) No results for input(s): BNP in the last 8760 hours.  ProBNP (last 3 results) No results for input(s): PROBNP in the last 8760 hours.  Radiological  Exams: No results found.  Assessment/Plan Active Problems:   Acute on chronic respiratory failure with hypoxia (HCC)   Muscular dystrophy (HCC)   Healthcare-associated pneumonia   Tracheostomy status (HCC)   1. Acute on chronic respiratory failure with hypoxia continue with T collar trials continue pulmonary toilet supportive care. 2. Muscular dystrophy is at baseline resting on the ventilator at night 3. Healthcare associated pneumonia treated we will continue to monitor 4. Tracheostomy will remain in place   I have personally seen and evaluated the patient, evaluated laboratory and imaging results, formulated the assessment and plan and placed orders. The Patient requires high complexity decision making for assessment and support.  Case was discussed on Rounds with the Respiratory Therapy Staff  Yevonne PaxSaadat A , MD [facility] Pulmonary Critical Care Medicine Sleep Medicine

## 2018-07-14 DIAGNOSIS — J9621 Acute and chronic respiratory failure with hypoxia: Secondary | ICD-10-CM | POA: Diagnosis not present

## 2018-07-14 DIAGNOSIS — G7100 Muscular dystrophy, unspecified: Secondary | ICD-10-CM | POA: Diagnosis not present

## 2018-07-14 DIAGNOSIS — Z930 Tracheostomy status: Secondary | ICD-10-CM | POA: Diagnosis not present

## 2018-07-14 DIAGNOSIS — J189 Pneumonia, unspecified organism: Secondary | ICD-10-CM | POA: Diagnosis not present

## 2018-07-14 LAB — CBC
HEMATOCRIT: 38.5 % — AB (ref 39.0–52.0)
HEMOGLOBIN: 12 g/dL — AB (ref 13.0–17.0)
MCH: 30.5 pg (ref 26.0–34.0)
MCHC: 31.2 g/dL (ref 30.0–36.0)
MCV: 97.7 fL (ref 80.0–100.0)
Platelets: 303 10*3/uL (ref 150–400)
RBC: 3.94 MIL/uL — ABNORMAL LOW (ref 4.22–5.81)
RDW: 14 % (ref 11.5–15.5)
WBC: 7.3 10*3/uL (ref 4.0–10.5)
nRBC: 0 % (ref 0.0–0.2)

## 2018-07-14 LAB — BASIC METABOLIC PANEL
Anion gap: 5 (ref 5–15)
BUN: 25 mg/dL — ABNORMAL HIGH (ref 8–23)
CHLORIDE: 101 mmol/L (ref 98–111)
CO2: 32 mmol/L (ref 22–32)
CREATININE: 0.83 mg/dL (ref 0.61–1.24)
Calcium: 9.4 mg/dL (ref 8.9–10.3)
GFR calc non Af Amer: >60 mL/min (ref >60)
GLUCOSE: 102 mg/dL — AB (ref 70–99)
Potassium: 4.3 mmol/L (ref 3.5–5.1)
Sodium: 138 mmol/L (ref 135–145)

## 2018-07-14 LAB — PHOSPHORUS: PHOSPHORUS: 3.7 mg/dL (ref 2.5–4.6)

## 2018-07-14 LAB — MAGNESIUM: Magnesium: 2.4 mg/dL (ref 1.7–2.4)

## 2018-07-14 NOTE — Progress Notes (Signed)
[facility]   PULMONARY CRITICAL CARE SERVICE  PROGRESS NOTE  Date of Service: 07/14/2018  Carl Galvan  UJW:[id number]  DOB: 08/26/1956   DOA: 06/27/2018  Referring Physician: Carron CurieAli Hijazi, MD  HPI: Carl Galvan is a 62 y.o. male seen for follow up of Acute on Chronic Respiratory Failure.  Patient is on T collar resting at baseline off the ventilator and then back on the ventilator at night  Medications: Reviewed on Rounds  Physical Exam:  Vitals: Temperature 97.5 pulse 87 respiratory 16 blood pressure 101/62 saturation 97%  Ventilator Settings off the ventilator on T collar currently  . General: Comfortable at this time . Eyes: Grossly normal lids, irises & conjunctiva . ENT: grossly tongue is normal . Neck: no obvious mass . Cardiovascular: S1 S2 normal no gallop . Respiratory: No rhonchi or rales are noted . Abdomen: soft . Skin: no rash seen on limited exam . Musculoskeletal: not rigid . Psychiatric:unable to assess . Neurologic: no seizure no involuntary movements         Lab Data:   Basic Metabolic Panel: Recent Labs  Lab 07/09/18 0533 07/14/18 0650  NA 139 138  K 4.5 4.3  CL 101 101  CO2 32 32  GLUCOSE 90 102*  BUN 30* 25*  CREATININE 0.80 0.83  CALCIUM 9.8 9.4  MG 2.5* 2.4  PHOS 3.8 3.7    ABG: No results for input(s): PHART, PCO2ART, PO2ART, HCO3, O2SAT in the last 168 hours.  Liver Function Tests: No results for input(s): AST, ALT, ALKPHOS, BILITOT, PROT, ALBUMIN in the last 168 hours. No results for input(s): LIPASE, AMYLASE in the last 168 hours. No results for input(s): AMMONIA in the last 168 hours.  CBC: Recent Labs  Lab 07/09/18 0533 07/14/18 0650  WBC 6.4 7.3  HGB 12.5* 12.0*  HCT 40.3 38.5*  MCV 97.8 97.7  PLT 340 303    Cardiac Enzymes: No results for input(s): CKTOTAL, CKMB, CKMBINDEX, TROPONINI in the last 168 hours.  BNP (last 3 results) No results for input(s):  BNP in the last 8760 hours.  ProBNP (last 3 results) No results for input(s): PROBNP in the last 8760 hours.  Radiological Exams: No results found.  Assessment/Plan Active Problems:   Acute on chronic respiratory failure with hypoxia (HCC)   Muscular dystrophy (HCC)   Healthcare-associated pneumonia   Tracheostomy status (HCC)   1. Acute on chronic respiratory failure hypoxia continue weaning as above.  Rest on the ventilator at night. 2. Muscular dystrophy at baseline continue to monitor 3. Healthcare associated pneumonia treated 4. Tracheostomy remains in place   I have personally seen and evaluated the patient, evaluated laboratory and imaging results, formulated the assessment and plan and placed orders. The Patient requires high complexity decision making for assessment and support.  Case was discussed on Rounds with the Respiratory Therapy Staff  Yevonne PaxSaadat A Damier Disano, MD [facility] Pulmonary Critical Care Medicine Sleep Medicine

## 2018-07-15 DIAGNOSIS — J189 Pneumonia, unspecified organism: Secondary | ICD-10-CM | POA: Diagnosis not present

## 2018-07-15 DIAGNOSIS — Z930 Tracheostomy status: Secondary | ICD-10-CM | POA: Diagnosis not present

## 2018-07-15 DIAGNOSIS — J9621 Acute and chronic respiratory failure with hypoxia: Secondary | ICD-10-CM | POA: Diagnosis not present

## 2018-07-15 DIAGNOSIS — G7100 Muscular dystrophy, unspecified: Secondary | ICD-10-CM | POA: Diagnosis not present

## 2018-07-15 NOTE — Progress Notes (Signed)
[facility]   PULMONARY CRITICAL CARE SERVICE  PROGRESS NOTE  Date of Service: 07/15/2018  Carl Catholicndre R Tolleson  WUJ:[id number]  DOB: 01/20/1956   DOA: 06/27/2018  Referring Physician: Carron CurieAli Hijazi, MD  HPI: Carl Galvan is a 62 y.o. male seen for follow up of Acute on Chronic Respiratory Failure.  Currently on T collar patient been on 28% FiO2  Medications: Reviewed on Rounds  Physical Exam:  Vitals: Temperature 98.4 pulse 83 respiratory 15 blood pressure 99/58 saturation 94%  Ventilator Settings off the ventilator on T collar FiO2 28%  . General: Comfortable at this time . Eyes: Grossly normal lids, irises & conjunctiva . ENT: grossly tongue is normal . Neck: no obvious mass . Cardiovascular: S1 S2 normal no gallop . Respiratory: No rhonchi or rales are noted . Abdomen: soft . Skin: no rash seen on limited exam . Musculoskeletal: not rigid . Psychiatric:unable to assess . Neurologic: no seizure no involuntary movements         Lab Data:   Basic Metabolic Panel: Recent Labs  Lab 07/09/18 0533 07/14/18 0650  NA 139 138  K 4.5 4.3  CL 101 101  CO2 32 32  GLUCOSE 90 102*  BUN 30* 25*  CREATININE 0.80 0.83  CALCIUM 9.8 9.4  MG 2.5* 2.4  PHOS 3.8 3.7    ABG: No results for input(s): PHART, PCO2ART, PO2ART, HCO3, O2SAT in the last 168 hours.  Liver Function Tests: No results for input(s): AST, ALT, ALKPHOS, BILITOT, PROT, ALBUMIN in the last 168 hours. No results for input(s): LIPASE, AMYLASE in the last 168 hours. No results for input(s): AMMONIA in the last 168 hours.  CBC: Recent Labs  Lab 07/09/18 0533 07/14/18 0650  WBC 6.4 7.3  HGB 12.5* 12.0*  HCT 40.3 38.5*  MCV 97.8 97.7  PLT 340 303    Cardiac Enzymes: No results for input(s): CKTOTAL, CKMB, CKMBINDEX, TROPONINI in the last 168 hours.  BNP (last 3 results) No results for input(s): BNP in the last 8760 hours.  ProBNP (last 3 results) No  results for input(s): PROBNP in the last 8760 hours.  Radiological Exams: No results found.  Assessment/Plan Active Problems:   Acute on chronic respiratory failure with hypoxia (HCC)   Muscular dystrophy (HCC)   Healthcare-associated pneumonia   Tracheostomy status (HCC)   1. Acute on chronic respiratory failure with hypoxia we will continue with T collar trials and then rest on the ventilator at nighttime.  He should be off the ventilator for maximum of 14 hours 2. Muscular dystrophy at baseline continue supportive care 3. Healthcare associated pneumonia treated 4. Tracheostomy remains in place   I have personally seen and evaluated the patient, evaluated laboratory and imaging results, formulated the assessment and plan and placed orders. The Patient requires high complexity decision making for assessment and support.  Case was discussed on Rounds with the Respiratory Therapy Staff  Yevonne PaxSaadat A Mikhaela Zaugg, MD [facility] Pulmonary Critical Care Medicine Sleep Medicine

## 2018-07-16 DIAGNOSIS — J9621 Acute and chronic respiratory failure with hypoxia: Secondary | ICD-10-CM | POA: Diagnosis not present

## 2018-07-16 DIAGNOSIS — J189 Pneumonia, unspecified organism: Secondary | ICD-10-CM | POA: Diagnosis not present

## 2018-07-16 DIAGNOSIS — Z930 Tracheostomy status: Secondary | ICD-10-CM | POA: Diagnosis not present

## 2018-07-16 DIAGNOSIS — G7100 Muscular dystrophy, unspecified: Secondary | ICD-10-CM | POA: Diagnosis not present

## 2018-07-16 NOTE — Progress Notes (Signed)
Pulmonary Critical Care [facility]   PULMONARY CRITICAL CARE SERVICE  PROGRESS NOTE  Date of Service: 07/16/2018  Carl Catholicndre R Gubler  ZOX:[id number]  DOB: 09/25/1955   DOA: 06/27/2018  Referring Physician: Carron CurieAli Hijazi, MD  HPI: Carl Galvan is a 62 y.o. male seen for follow up of Acute on Chronic Respiratory Failure.  Patient is on 28% T collar at this time resting on PSV at night  Medications: Reviewed on Rounds  Physical Exam:  Vitals: Temperature 98.8 pulse 80 respiratory rate 18 blood pressure 90/51 saturation 95%  Ventilator Settings currently on T collar FiO2 28%  . General: Comfortable at this time . Eyes: Grossly normal lids, irises & conjunctiva . ENT: grossly tongue is normal . Neck: no obvious mass . Cardiovascular: S1 S2 normal no gallop . Respiratory: No rhonchi or rales . Abdomen: soft . Skin: no rash seen on limited exam . Musculoskeletal: not rigid . Psychiatric:unable to assess . Neurologic: no seizure no involuntary movements         Lab Data:   Basic Metabolic Panel: Recent Labs  Lab 07/14/18 0650  NA 138  K 4.3  CL 101  CO2 32  GLUCOSE 102*  BUN 25*  CREATININE 0.83  CALCIUM 9.4  MG 2.4  PHOS 3.7    ABG: No results for input(s): PHART, PCO2ART, PO2ART, HCO3, O2SAT in the last 168 hours.  Liver Function Tests: No results for input(s): AST, ALT, ALKPHOS, BILITOT, PROT, ALBUMIN in the last 168 hours. No results for input(s): LIPASE, AMYLASE in the last 168 hours. No results for input(s): AMMONIA in the last 168 hours.  CBC: Recent Labs  Lab 07/14/18 0650  WBC 7.3  HGB 12.0*  HCT 38.5*  MCV 97.7  PLT 303    Cardiac Enzymes: No results for input(s): CKTOTAL, CKMB, CKMBINDEX, TROPONINI in the last 168 hours.  BNP (last 3 results) No results for input(s): BNP in the last 8760 hours.  ProBNP (last 3 results) No results for input(s): PROBNP in the last 8760 hours.  Radiological Exams: No results  found.  Assessment/Plan Active Problems:   Acute on chronic respiratory failure with hypoxia (HCC)   Muscular dystrophy (HCC)   Healthcare-associated pneumonia   Tracheostomy status (HCC)   1. Acute on chronic respiratory failure with hypoxia patient is on T collar will continue with supportive care and rest on pressure support at night 2. Muscular dystrophy at baseline 3. Healthcare associated pneumonia treated 4. Tracheostomy will remain in place   I have personally seen and evaluated the patient, evaluated laboratory and imaging results, formulated the assessment and plan and placed orders. The Patient requires high complexity decision making for assessment and support.  Case was discussed on Rounds with the Respiratory Therapy Staff  Yevonne PaxSaadat A Chrystina Naff, MD [facility] Pulmonary Critical Care Medicine Sleep Medicine

## 2018-07-17 DIAGNOSIS — J189 Pneumonia, unspecified organism: Secondary | ICD-10-CM | POA: Diagnosis not present

## 2018-07-17 DIAGNOSIS — G7100 Muscular dystrophy, unspecified: Secondary | ICD-10-CM | POA: Diagnosis not present

## 2018-07-17 DIAGNOSIS — Z930 Tracheostomy status: Secondary | ICD-10-CM | POA: Diagnosis not present

## 2018-07-17 DIAGNOSIS — J9621 Acute and chronic respiratory failure with hypoxia: Secondary | ICD-10-CM | POA: Diagnosis not present

## 2018-07-17 NOTE — Progress Notes (Signed)
Pulmonary Critical Care [facility]   PULMONARY CRITICAL CARE SERVICE  PROGRESS NOTE  Date of Service: 07/17/2018  Carl Catholicndre R Kasparek  UJW:[id number]  DOB: 08/02/1956   DOA: 06/27/2018  Referring Physician: Carron CurieAli Hijazi, MD  HPI: Carl Galvan is a 62 y.o. male seen for follow up of Acute on Chronic Respiratory Failure.  Patient currently is on T collar during the daytime resting on the ventilator at night  Medications: Reviewed on Rounds  Physical Exam:  Vitals: Temperature 97.4 pulse 76 respiratory rate 17 blood pressure 113/67 saturations 97%  Ventilator Settings off the ventilator on T collar right now  . General: Comfortable at this time . Eyes: Grossly normal lids, irises & conjunctiva . ENT: grossly tongue is normal . Neck: no obvious mass . Cardiovascular: S1 S2 normal no gallop . Respiratory: Coarse breath sounds without rhonchi . Abdomen: soft . Skin: no rash seen on limited exam . Musculoskeletal: not rigid . Psychiatric:unable to assess . Neurologic: no seizure no involuntary movements         Lab Data:   Basic Metabolic Panel: Recent Labs  Lab 07/14/18 0650  NA 138  K 4.3  CL 101  CO2 32  GLUCOSE 102*  BUN 25*  CREATININE 0.83  CALCIUM 9.4  MG 2.4  PHOS 3.7    ABG: No results for input(s): PHART, PCO2ART, PO2ART, HCO3, O2SAT in the last 168 hours.  Liver Function Tests: No results for input(s): AST, ALT, ALKPHOS, BILITOT, PROT, ALBUMIN in the last 168 hours. No results for input(s): LIPASE, AMYLASE in the last 168 hours. No results for input(s): AMMONIA in the last 168 hours.  CBC: Recent Labs  Lab 07/14/18 0650  WBC 7.3  HGB 12.0*  HCT 38.5*  MCV 97.7  PLT 303    Cardiac Enzymes: No results for input(s): CKTOTAL, CKMB, CKMBINDEX, TROPONINI in the last 168 hours.  BNP (last 3 results) No results for input(s): BNP in the last 8760 hours.  ProBNP (last 3 results) No results for input(s): PROBNP in the  last 8760 hours.  Radiological Exams: No results found.  Assessment/Plan Active Problems:   Acute on chronic respiratory failure with hypoxia (HCC)   Muscular dystrophy (HCC)   Healthcare-associated pneumonia   Tracheostomy status (HCC)   1. Acute on chronic respiratory failure with hypoxia continue with the weaning on T collar continue secretion management pulmonary toilet. 2. Muscular dystrophy at baseline we will continue to monitor 3. Healthcare associated pneumonia treated 4. Tracheostomy remains in place   I have personally seen and evaluated the patient, evaluated laboratory and imaging results, formulated the assessment and plan and placed orders. The Patient requires high complexity decision making for assessment and support.  Case was discussed on Rounds with the Respiratory Therapy Staff  Yevonne PaxSaadat A Enrique Manganaro, MD [facility] Pulmonary Critical Care Medicine Sleep Medicine

## 2018-07-18 DIAGNOSIS — Z930 Tracheostomy status: Secondary | ICD-10-CM | POA: Diagnosis not present

## 2018-07-18 DIAGNOSIS — J189 Pneumonia, unspecified organism: Secondary | ICD-10-CM | POA: Diagnosis not present

## 2018-07-18 DIAGNOSIS — G7100 Muscular dystrophy, unspecified: Secondary | ICD-10-CM | POA: Diagnosis not present

## 2018-07-18 DIAGNOSIS — J9621 Acute and chronic respiratory failure with hypoxia: Secondary | ICD-10-CM | POA: Diagnosis not present

## 2018-07-18 NOTE — Progress Notes (Signed)
Pulmonary Critical Care Medicine [facility]   PULMONARY CRITICAL CARE SERVICE  PROGRESS NOTE  Date of Service: 07/18/2018  Carl Galvan  OZH:[id number]  DOB: 01/18/1956   DOA: 06/27/2018  Referring Physician: Carron CurieAli Hijazi, MD  HPI: Carl Catholicndre R Nangle is a 62 y.o. male seen for follow up of Acute on Chronic Respiratory Failure.  She is doing fine right now on T collar doing 28% FiO2  Medications: Reviewed on Rounds  Physical Exam:  Vitals: Temperature 97.4 pulse 74 respiratory 20 blood pressure 97/58 saturations 94%  Ventilator Settings off the ventilator on T collar  . General: Comfortable at this time . Eyes: Grossly normal lids, irises & conjunctiva . ENT: grossly tongue is normal . Neck: no obvious mass . Cardiovascular: S1 S2 normal no gallop . Respiratory: No rhonchi no rales . Abdomen: soft . Skin: no rash seen on limited exam . Musculoskeletal: not rigid . Psychiatric:unable to assess . Neurologic: no seizure no involuntary movements         Lab Data:   Basic Metabolic Panel: Recent Labs  Lab 07/14/18 0650  NA 138  K 4.3  CL 101  CO2 32  GLUCOSE 102*  BUN 25*  CREATININE 0.83  CALCIUM 9.4  MG 2.4  PHOS 3.7    ABG: No results for input(s): PHART, PCO2ART, PO2ART, HCO3, O2SAT in the last 168 hours.  Liver Function Tests: No results for input(s): AST, ALT, ALKPHOS, BILITOT, PROT, ALBUMIN in the last 168 hours. No results for input(s): LIPASE, AMYLASE in the last 168 hours. No results for input(s): AMMONIA in the last 168 hours.  CBC: Recent Labs  Lab 07/14/18 0650  WBC 7.3  HGB 12.0*  HCT 38.5*  MCV 97.7  PLT 303    Cardiac Enzymes: No results for input(s): CKTOTAL, CKMB, CKMBINDEX, TROPONINI in the last 168 hours.  BNP (last 3 results) No results for input(s): BNP in the last 8760 hours.  ProBNP (last 3 results) No results for input(s): PROBNP in the last 8760 hours.  Radiological Exams: No results  found.  Assessment/Plan Active Problems:   Acute on chronic respiratory failure with hypoxia (HCC)   Muscular dystrophy (HCC)   Healthcare-associated pneumonia   Tracheostomy status (HCC)   1. Acute on chronic respiratory failure with hypoxia we will continue to collar trials continue aggressive pulmonary toilet supportive care 2. Muscular dystrophy at baseline resting on the vent at night 3. Healthcare associated pneumonia treated 4. Tracheostomy remains in place   I have personally seen and evaluated the patient, evaluated laboratory and imaging results, formulated the assessment and plan and placed orders. The Patient requires high complexity decision making for assessment and support.  Case was discussed on Rounds with the Respiratory Therapy Staff  Yevonne PaxSaadat A , MD [facility] Pulmonary Critical Care Medicine Sleep Medicine

## 2018-07-19 DIAGNOSIS — J9621 Acute and chronic respiratory failure with hypoxia: Secondary | ICD-10-CM | POA: Diagnosis not present

## 2018-07-19 DIAGNOSIS — J189 Pneumonia, unspecified organism: Secondary | ICD-10-CM | POA: Diagnosis not present

## 2018-07-19 DIAGNOSIS — Z930 Tracheostomy status: Secondary | ICD-10-CM | POA: Diagnosis not present

## 2018-07-19 DIAGNOSIS — G7100 Muscular dystrophy, unspecified: Secondary | ICD-10-CM | POA: Diagnosis not present

## 2018-07-19 LAB — CBC
HCT: 39.4 % (ref 39.0–52.0)
Hemoglobin: 12 g/dL — ABNORMAL LOW (ref 13.0–17.0)
MCH: 29.6 pg (ref 26.0–34.0)
MCHC: 30.5 g/dL (ref 30.0–36.0)
MCV: 97.3 fL (ref 80.0–100.0)
NRBC: 0 % (ref 0.0–0.2)
PLATELETS: 257 10*3/uL (ref 150–400)
RBC: 4.05 MIL/uL — AB (ref 4.22–5.81)
RDW: 13.7 % (ref 11.5–15.5)
WBC: 7 10*3/uL (ref 4.0–10.5)

## 2018-07-19 LAB — BASIC METABOLIC PANEL
ANION GAP: 10 (ref 5–15)
BUN: 26 mg/dL — ABNORMAL HIGH (ref 8–23)
CHLORIDE: 97 mmol/L — AB (ref 98–111)
CO2: 30 mmol/L (ref 22–32)
Calcium: 9.6 mg/dL (ref 8.9–10.3)
Creatinine, Ser: 0.84 mg/dL (ref 0.61–1.24)
GFR calc Af Amer: >60 mL/min (ref >60)
GLUCOSE: 102 mg/dL — AB (ref 70–99)
POTASSIUM: 4.5 mmol/L (ref 3.5–5.1)
SODIUM: 137 mmol/L (ref 135–145)

## 2018-07-19 LAB — MAGNESIUM: MAGNESIUM: 2.3 mg/dL (ref 1.7–2.4)

## 2018-07-19 NOTE — Progress Notes (Signed)
Pulmonary [facility]   PULMONARY CRITICAL CARE SERVICE  PROGRESS NOTE  Date of Service: 07/19/2018  Carl Catholicndre R Sobol  ZOX:[id number]  DOB: 07/24/1956   DOA: 06/27/2018  Referring Physician: Carron CurieAli Hijazi, MD  HPI: Carl Galvan is a 62 y.o. male seen for follow up of Acute on Chronic Respiratory Failure.  Patient is on T collar currently has been on 28% FiO2  Medications: Reviewed on Rounds  Physical Exam:  Vitals: Temperature 98.0 pulse 78 respiratory 19 blood pressure 112/75 saturations 95%  Ventilator Settings off the ventilator on T collar right now  . General: Comfortable at this time . Eyes: Grossly normal lids, irises & conjunctiva . ENT: grossly tongue is normal . Neck: no obvious mass . Cardiovascular: S1 S2 normal no gallop . Respiratory: No rhonchi or rales are noted . Abdomen: soft . Skin: no rash seen on limited exam . Musculoskeletal: not rigid . Psychiatric:unable to assess . Neurologic: no seizure no involuntary movements         Lab Data:   Basic Metabolic Panel: Recent Labs  Lab 07/14/18 0650 07/19/18 0646  NA 138 137  K 4.3 4.5  CL 101 97*  CO2 32 30  GLUCOSE 102* 102*  BUN 25* 26*  CREATININE 0.83 0.84  CALCIUM 9.4 9.6  MG 2.4 2.3  PHOS 3.7  --     ABG: No results for input(s): PHART, PCO2ART, PO2ART, HCO3, O2SAT in the last 168 hours.  Liver Function Tests: No results for input(s): AST, ALT, ALKPHOS, BILITOT, PROT, ALBUMIN in the last 168 hours. No results for input(s): LIPASE, AMYLASE in the last 168 hours. No results for input(s): AMMONIA in the last 168 hours.  CBC: Recent Labs  Lab 07/14/18 0650 07/19/18 0646  WBC 7.3 7.0  HGB 12.0* 12.0*  HCT 38.5* 39.4  MCV 97.7 97.3  PLT 303 257    Cardiac Enzymes: No results for input(s): CKTOTAL, CKMB, CKMBINDEX, TROPONINI in the last 168 hours.  BNP (last 3 results) No results for input(s): BNP in the last 8760 hours.  ProBNP (last 3  results) No results for input(s): PROBNP in the last 8760 hours.  Radiological Exams: No results found.  Assessment/Plan Active Problems:   Acute on chronic respiratory failure with hypoxia (HCC)   Muscular dystrophy (HCC)   Healthcare-associated pneumonia   Tracheostomy status (HCC)   1. Acute on chronic respiratory failure with hypoxia we will continue with T collar trials during the daytime and rest on the ventilator at nighttime continue pulmonary toilet supportive care. 2. Muscular dystrophy at baseline continue to monitor. 3. Healthcare associated pneumonia treated clinically resolved 4. Tracheostomy will remain in place   I have personally seen and evaluated the patient, evaluated laboratory and imaging results, formulated the assessment and plan and placed orders. The Patient requires high complexity decision making for assessment and support.  Case was discussed on Rounds with the Respiratory Therapy Staff  Yevonne PaxSaadat A Khan, MD [facility] Pulmonary Critical Care Medicine Sleep Medicine

## 2018-07-20 DIAGNOSIS — G7100 Muscular dystrophy, unspecified: Secondary | ICD-10-CM | POA: Diagnosis not present

## 2018-07-20 DIAGNOSIS — J9621 Acute and chronic respiratory failure with hypoxia: Secondary | ICD-10-CM | POA: Diagnosis not present

## 2018-07-20 DIAGNOSIS — J189 Pneumonia, unspecified organism: Secondary | ICD-10-CM | POA: Diagnosis not present

## 2018-07-20 DIAGNOSIS — Z930 Tracheostomy status: Secondary | ICD-10-CM | POA: Diagnosis not present

## 2018-07-20 LAB — BASIC METABOLIC PANEL
Anion gap: 10 (ref 5–15)
BUN: 26 mg/dL — AB (ref 8–23)
CHLORIDE: 98 mmol/L (ref 98–111)
CO2: 28 mmol/L (ref 22–32)
Calcium: 9.4 mg/dL (ref 8.9–10.3)
Creatinine, Ser: 0.83 mg/dL (ref 0.61–1.24)
GFR calc Af Amer: >60 mL/min (ref >60)
GFR calc non Af Amer: >60 mL/min (ref >60)
Glucose, Bld: 111 mg/dL — ABNORMAL HIGH (ref 70–99)
POTASSIUM: 4.4 mmol/L (ref 3.5–5.1)
SODIUM: 136 mmol/L (ref 135–145)

## 2018-07-20 LAB — CBC
HEMATOCRIT: 37.1 % — AB (ref 39.0–52.0)
HEMOGLOBIN: 11.6 g/dL — AB (ref 13.0–17.0)
MCH: 30.1 pg (ref 26.0–34.0)
MCHC: 31.3 g/dL (ref 30.0–36.0)
MCV: 96.4 fL (ref 80.0–100.0)
Platelets: 283 10*3/uL (ref 150–400)
RBC: 3.85 MIL/uL — ABNORMAL LOW (ref 4.22–5.81)
RDW: 13.9 % (ref 11.5–15.5)
WBC: 5.8 10*3/uL (ref 4.0–10.5)
nRBC: 0 % (ref 0.0–0.2)

## 2018-07-20 LAB — MAGNESIUM: Magnesium: 2.3 mg/dL (ref 1.7–2.4)

## 2018-07-20 NOTE — Progress Notes (Signed)
[facility]   PULMONARY CRITICAL CARE SERVICE  PROGRESS NOTE  Date of Service: 07/20/2018  Carl Catholicndre R Marrufo  ONG:[id number]  DOB: 02/18/1956   DOA: 06/27/2018  Referring Physician: Carron CurieAli Hijazi, MD  HPI: Carl Galvan is a 62 y.o. male seen for follow up of Acute on Chronic Respiratory Failure.  Patient is currently tolerating trach collar at 28% FiO2.  Medications: Reviewed on Rounds  Physical Exam:  Vitals: Temperature 98.2 pulse 82 respirations 20 blood pressure 116/70 temperature 97.7.  Ventilator Settings patient is not currently on ventilator remains on trach collar.  . General: Comfortable at this time . Eyes: Grossly normal lids, irises & conjunctiva . ENT: grossly tongue is normal . Neck: no obvious mass . Cardiovascular: S1 S2 normal no gallop . Respiratory: Coarse breath sounds bilaterally . Abdomen: soft . Skin: no rash seen on limited exam . Musculoskeletal: not rigid . Psychiatric:unable to assess . Neurologic: no seizure no involuntary movements         Lab Data:   Basic Metabolic Panel: Recent Labs  Lab 07/14/18 0650 07/19/18 0646 07/20/18 0437  NA 138 137 136  K 4.3 4.5 4.4  CL 101 97* 98  CO2 32 30 28  GLUCOSE 102* 102* 111*  BUN 25* 26* 26*  CREATININE 0.83 0.84 0.83  CALCIUM 9.4 9.6 9.4  MG 2.4 2.3 2.3  PHOS 3.7  --   --     ABG: No results for input(s): PHART, PCO2ART, PO2ART, HCO3, O2SAT in the last 168 hours.  Liver Function Tests: No results for input(s): AST, ALT, ALKPHOS, BILITOT, PROT, ALBUMIN in the last 168 hours. No results for input(s): LIPASE, AMYLASE in the last 168 hours. No results for input(s): AMMONIA in the last 168 hours.  CBC: Recent Labs  Lab 07/14/18 0650 07/19/18 0646 07/20/18 0437  WBC 7.3 7.0 5.8  HGB 12.0* 12.0* 11.6*  HCT 38.5* 39.4 37.1*  MCV 97.7 97.3 96.4  PLT 303 257 283    Cardiac Enzymes: No results for input(s): CKTOTAL, CKMB, CKMBINDEX,  TROPONINI in the last 168 hours.  BNP (last 3 results) No results for input(s): BNP in the last 8760 hours.  ProBNP (last 3 results) No results for input(s): PROBNP in the last 8760 hours.  Radiological Exams: No results found.  Assessment/Plan Active Problems:   Acute on chronic respiratory failure with hypoxia (HCC)   Muscular dystrophy (HCC)   Healthcare-associated pneumonia   Tracheostomy status (HCC)   1. Acute on chronic respiratory failure with hypoxia continue trach collar trials as well as resting patient on ventilator overnight.  Continue supportive care in the form of pulmonary toilet. 2. Muscular dystrophy at baseline continue to monitor. 3. Healthcare associated pneumonia resolved. 4. Tracheostomy will remain in place at this time.   I have personally seen and evaluated the patient, evaluated laboratory and imaging results, formulated the assessment and plan and placed orders. The Patient requires high complexity decision making for assessment and support.  Case was discussed on Rounds with the Respiratory Therapy Staff  Yevonne PaxSaadat A Khan, MD [facility] Pulmonary Critical Care Medicine Sleep Medicine

## 2018-07-21 DIAGNOSIS — J189 Pneumonia, unspecified organism: Secondary | ICD-10-CM | POA: Diagnosis not present

## 2018-07-21 DIAGNOSIS — J9621 Acute and chronic respiratory failure with hypoxia: Secondary | ICD-10-CM | POA: Diagnosis not present

## 2018-07-21 DIAGNOSIS — G7100 Muscular dystrophy, unspecified: Secondary | ICD-10-CM | POA: Diagnosis not present

## 2018-07-21 DIAGNOSIS — Z930 Tracheostomy status: Secondary | ICD-10-CM | POA: Diagnosis not present

## 2018-07-21 LAB — CBC
HEMATOCRIT: 41.9 % (ref 39.0–52.0)
HEMOGLOBIN: 12.8 g/dL — AB (ref 13.0–17.0)
MCH: 29.5 pg (ref 26.0–34.0)
MCHC: 30.5 g/dL (ref 30.0–36.0)
MCV: 96.5 fL (ref 80.0–100.0)
NRBC: 0 % (ref 0.0–0.2)
Platelets: 274 10*3/uL (ref 150–400)
RBC: 4.34 MIL/uL (ref 4.22–5.81)
RDW: 13.8 % (ref 11.5–15.5)
WBC: 7.7 10*3/uL (ref 4.0–10.5)

## 2018-07-21 LAB — BASIC METABOLIC PANEL
ANION GAP: 9 (ref 5–15)
BUN: 30 mg/dL — ABNORMAL HIGH (ref 8–23)
CO2: 30 mmol/L (ref 22–32)
Calcium: 9.5 mg/dL (ref 8.9–10.3)
Chloride: 100 mmol/L (ref 98–111)
Creatinine, Ser: 0.88 mg/dL (ref 0.61–1.24)
GFR calc Af Amer: >60 mL/min (ref >60)
GFR calc non Af Amer: >60 mL/min (ref >60)
Glucose, Bld: 89 mg/dL (ref 70–99)
POTASSIUM: 4.7 mmol/L (ref 3.5–5.1)
Sodium: 139 mmol/L (ref 135–145)

## 2018-07-21 LAB — MAGNESIUM: Magnesium: 2.4 mg/dL (ref 1.7–2.4)

## 2018-07-21 NOTE — Progress Notes (Signed)
[facility]   PULMONARY CRITICAL CARE SERVICE  PROGRESS NOTE  Date of Service: 07/21/2018  Malena Catholicndre R Acoff  ZOX:[id number]  DOB: 06/06/1956   DOA: 06/27/2018  Referring Physician: Carron CurieAli Hijazi, MD  HPI: Malena Catholicndre R Ruffini is a 62 y.o. male seen for follow up of Acute on Chronic Respiratory Failure.  Currently is on T collar at baseline resting on the ventilator at night  Medications: Reviewed on Rounds  Physical Exam:  Vitals: Temperature 97.9 pulse 88 respiratory rate 16 blood pressure 102/52 saturation 96%  Ventilator Settings off the ventilator right now on T collar  . General: Comfortable at this time . Eyes: Grossly normal lids, irises & conjunctiva . ENT: grossly tongue is normal . Neck: no obvious mass . Cardiovascular: S1 S2 normal no gallop . Respiratory: Coarse breath sounds with few rhonchi . Abdomen: soft . Skin: no rash seen on limited exam . Musculoskeletal: not rigid . Psychiatric:unable to assess . Neurologic: no seizure no involuntary movements         Lab Data:   Basic Metabolic Panel: Recent Labs  Lab 07/19/18 0646 07/20/18 0437 07/21/18 0656  NA 137 136 139  K 4.5 4.4 4.7  CL 97* 98 100  CO2 30 28 30   GLUCOSE 102* 111* 89  BUN 26* 26* 30*  CREATININE 0.84 0.83 0.88  CALCIUM 9.6 9.4 9.5  MG 2.3 2.3 2.4    ABG: No results for input(s): PHART, PCO2ART, PO2ART, HCO3, O2SAT in the last 168 hours.  Liver Function Tests: No results for input(s): AST, ALT, ALKPHOS, BILITOT, PROT, ALBUMIN in the last 168 hours. No results for input(s): LIPASE, AMYLASE in the last 168 hours. No results for input(s): AMMONIA in the last 168 hours.  CBC: Recent Labs  Lab 07/19/18 0646 07/20/18 0437 07/21/18 0656  WBC 7.0 5.8 7.7  HGB 12.0* 11.6* 12.8*  HCT 39.4 37.1* 41.9  MCV 97.3 96.4 96.5  PLT 257 283 274    Cardiac Enzymes: No results for input(s): CKTOTAL, CKMB, CKMBINDEX, TROPONINI in the last 168  hours.  BNP (last 3 results) No results for input(s): BNP in the last 8760 hours.  ProBNP (last 3 results) No results for input(s): PROBNP in the last 8760 hours.  Radiological Exams: No results found.  Assessment/Plan Active Problems:   Acute on chronic respiratory failure with hypoxia (HCC)   Muscular dystrophy (HCC)   Healthcare-associated pneumonia   Tracheostomy status (HCC)   1. Acute on chronic respiratory failure with hypoxia we will continue with T collar trials titrate oxygen as tolerated continue pulmonary toilet supportive care. 2. Muscular dystrophy at baseline patient remains on ventilator at night 3. Healthcare associated pneumonia treated we will continue to follow 4. Tracheostomy at baseline continue to monitor   I have personally seen and evaluated the patient, evaluated laboratory and imaging results, formulated the assessment and plan and placed orders. The Patient requires high complexity decision making for assessment and support.  Case was discussed on Rounds with the Respiratory Therapy Staff  Yevonne PaxSaadat A , MD [facility] Pulmonary Critical Care Medicine Sleep Medicine

## 2018-07-22 DIAGNOSIS — J189 Pneumonia, unspecified organism: Secondary | ICD-10-CM | POA: Diagnosis not present

## 2018-07-22 DIAGNOSIS — Z930 Tracheostomy status: Secondary | ICD-10-CM | POA: Diagnosis not present

## 2018-07-22 DIAGNOSIS — G7100 Muscular dystrophy, unspecified: Secondary | ICD-10-CM | POA: Diagnosis not present

## 2018-07-22 DIAGNOSIS — J9621 Acute and chronic respiratory failure with hypoxia: Secondary | ICD-10-CM | POA: Diagnosis not present

## 2018-07-22 NOTE — Progress Notes (Signed)
[facility]   PULMONARY CRITICAL CARE SERVICE  PROGRESS NOTE  Date of Service: 07/22/2018  Carl Catholicndre R Pryor  VWU:[id number]  DOB: 07/25/1956   DOA: 06/27/2018  Referring Physician: Carron CurieAli Hijazi, MD  HPI: Carl Galvan is a 62 y.o. male seen for follow up of Acute on Chronic Respiratory Failure.  Patient is on T collar right now has been on 20% oxygen good saturations noted  Medications: Reviewed on Rounds  Physical Exam:  Vitals: Temperature 96.4 pulse 78 respiratory rate 12 blood pressure 108/64 saturations 98%  Ventilator Settings currently off the ventilator on T collar  . General: Comfortable at this time . Eyes: Grossly normal lids, irises & conjunctiva . ENT: grossly tongue is normal . Neck: no obvious mass . Cardiovascular: S1 S2 normal no gallop . Respiratory: Coarse breath sounds no rhonchi are noted . Abdomen: soft . Skin: no rash seen on limited exam . Musculoskeletal: not rigid . Psychiatric:unable to assess . Neurologic: no seizure no involuntary movements         Lab Data:   Basic Metabolic Panel: Recent Labs  Lab 07/19/18 0646 07/20/18 0437 07/21/18 0656  NA 137 136 139  K 4.5 4.4 4.7  CL 97* 98 100  CO2 30 28 30   GLUCOSE 102* 111* 89  BUN 26* 26* 30*  CREATININE 0.84 0.83 0.88  CALCIUM 9.6 9.4 9.5  MG 2.3 2.3 2.4    ABG: No results for input(s): PHART, PCO2ART, PO2ART, HCO3, O2SAT in the last 168 hours.  Liver Function Tests: No results for input(s): AST, ALT, ALKPHOS, BILITOT, PROT, ALBUMIN in the last 168 hours. No results for input(s): LIPASE, AMYLASE in the last 168 hours. No results for input(s): AMMONIA in the last 168 hours.  CBC: Recent Labs  Lab 07/19/18 0646 07/20/18 0437 07/21/18 0656  WBC 7.0 5.8 7.7  HGB 12.0* 11.6* 12.8*  HCT 39.4 37.1* 41.9  MCV 97.3 96.4 96.5  PLT 257 283 274    Cardiac Enzymes: No results for input(s): CKTOTAL, CKMB, CKMBINDEX, TROPONINI in  the last 168 hours.  BNP (last 3 results) No results for input(s): BNP in the last 8760 hours.  ProBNP (last 3 results) No results for input(s): PROBNP in the last 8760 hours.  Radiological Exams: No results found.  Assessment/Plan Active Problems:   Acute on chronic respiratory failure with hypoxia (HCC)   Muscular dystrophy (HCC)   Healthcare-associated pneumonia   Tracheostomy status (HCC)   1. Acute on chronic respiratory failure with hypoxia continue with T collar during the daytime that the patient rest at night doing fairly well. 2. Muscular dystrophy 3. Continue to monitor 4. Healthcare associated pneumonia treated we will continue present therapy 5. Tracheostomy remain in place   I have personally seen and evaluated the patient, evaluated laboratory and imaging results, formulated the assessment and plan and placed orders. The Patient requires high complexity decision making for assessment and support.  Case was discussed on Rounds with the Respiratory Therapy Staff  Yevonne PaxSaadat A Khan, MD [facility] Pulmonary Critical Care Medicine Sleep Medicine

## 2018-07-23 LAB — MISC LABCORP TEST (SEND OUT): Labcorp test code: 183865

## 2020-01-26 DEATH — deceased
